# Patient Record
Sex: Female | Born: 1980 | Race: White | Hispanic: No | Marital: Married | Smoking: Current every day smoker
Health system: Southern US, Community
[De-identification: ages and names within clinical notes are randomized; demographics above are authoritative.]

## PROBLEM LIST (undated history)

## (undated) DIAGNOSIS — Z923 Personal history of irradiation: Secondary | ICD-10-CM

## (undated) DIAGNOSIS — G8929 Other chronic pain: Secondary | ICD-10-CM

## (undated) DIAGNOSIS — M7918 Myalgia, other site: Secondary | ICD-10-CM

## (undated) DIAGNOSIS — J45909 Unspecified asthma, uncomplicated: Secondary | ICD-10-CM

## (undated) DIAGNOSIS — R51 Headache: Principal | ICD-10-CM

## (undated) DIAGNOSIS — M1990 Unspecified osteoarthritis, unspecified site: Secondary | ICD-10-CM

## (undated) DIAGNOSIS — M549 Dorsalgia, unspecified: Secondary | ICD-10-CM

## (undated) HISTORY — PX: CHOLECYSTECTOMY: SHX55

## (undated) HISTORY — DX: Myalgia, other site: M79.18

## (undated) HISTORY — DX: Headache: R51

---

## 2003-06-30 ENCOUNTER — Emergency Department (HOSPITAL_COMMUNITY): Admission: EM | Admit: 2003-06-30 | Discharge: 2003-06-30 | Payer: Self-pay | Admitting: Emergency Medicine

## 2008-01-24 ENCOUNTER — Emergency Department (HOSPITAL_COMMUNITY): Admission: EM | Admit: 2008-01-24 | Discharge: 2008-01-24 | Payer: Self-pay | Admitting: Emergency Medicine

## 2008-04-10 ENCOUNTER — Emergency Department (HOSPITAL_COMMUNITY): Admission: EM | Admit: 2008-04-10 | Discharge: 2008-04-10 | Payer: Self-pay | Admitting: Emergency Medicine

## 2012-03-30 DIAGNOSIS — G8929 Other chronic pain: Secondary | ICD-10-CM

## 2012-03-30 HISTORY — DX: Other chronic pain: G89.29

## 2012-07-31 DIAGNOSIS — R079 Chest pain, unspecified: Secondary | ICD-10-CM

## 2013-01-12 ENCOUNTER — Encounter (HOSPITAL_COMMUNITY): Payer: Self-pay | Admitting: Emergency Medicine

## 2013-01-12 ENCOUNTER — Emergency Department (HOSPITAL_COMMUNITY)
Admission: EM | Admit: 2013-01-12 | Discharge: 2013-01-12 | Disposition: A | Discharge status: Home / Self Care | Payer: Self-pay | Attending: Emergency Medicine | Admitting: Emergency Medicine

## 2013-01-12 DIAGNOSIS — M25539 Pain in unspecified wrist: Secondary | ICD-10-CM | POA: Insufficient documentation

## 2013-01-12 DIAGNOSIS — J45909 Unspecified asthma, uncomplicated: Secondary | ICD-10-CM | POA: Insufficient documentation

## 2013-01-12 DIAGNOSIS — M25579 Pain in unspecified ankle and joints of unspecified foot: Secondary | ICD-10-CM | POA: Insufficient documentation

## 2013-01-12 DIAGNOSIS — F17200 Nicotine dependence, unspecified, uncomplicated: Secondary | ICD-10-CM | POA: Insufficient documentation

## 2013-01-12 DIAGNOSIS — M25569 Pain in unspecified knee: Secondary | ICD-10-CM | POA: Insufficient documentation

## 2013-01-12 DIAGNOSIS — G8929 Other chronic pain: Secondary | ICD-10-CM | POA: Insufficient documentation

## 2013-01-12 DIAGNOSIS — R1013 Epigastric pain: Secondary | ICD-10-CM | POA: Insufficient documentation

## 2013-01-12 DIAGNOSIS — R209 Unspecified disturbances of skin sensation: Secondary | ICD-10-CM | POA: Insufficient documentation

## 2013-01-12 DIAGNOSIS — Z8739 Personal history of other diseases of the musculoskeletal system and connective tissue: Secondary | ICD-10-CM | POA: Insufficient documentation

## 2013-01-12 DIAGNOSIS — Z91040 Latex allergy status: Secondary | ICD-10-CM | POA: Insufficient documentation

## 2013-01-12 HISTORY — DX: Unspecified osteoarthritis, unspecified site: M19.90

## 2013-01-12 HISTORY — DX: Unspecified asthma, uncomplicated: J45.909

## 2013-01-12 MED ORDER — FAMOTIDINE 20 MG PO TABS: 20.0000 mg | ORAL_TABLET | Freq: Two times a day (BID) | ORAL | Status: DC

## 2013-01-12 MED ORDER — OXYCODONE-ACETAMINOPHEN 5-325 MG PO TABS
1.0000 | ORAL_TABLET | Freq: Once | ORAL | Status: AC
  Administered 2013-01-12: 1 via ORAL
  Filled 2013-01-12: qty 1

## 2013-01-12 MED ORDER — MELOXICAM 7.5 MG PO TABS: 7.5000 mg | ORAL_TABLET | Freq: Every day | ORAL | Status: DC

## 2013-01-12 NOTE — ED Notes (Signed)
Chronic pain in back, knees, wrists x 1 month. Bilateral toe pain x more than 15 years.  Went to [facility] over month ago and was given Prednisone and Zanaflex for arthritis. No films were done.  Patient states meds didn't help and Zanaflex made her sick.  Has been taking Ibuprofen 600-800mg  q 4 months since other meds ran out.  No nausea/vomiting, but having epigastric pain intermittently.  No blood in stools.

## 2013-01-12 NOTE — ED Provider Notes (Signed)
CSN: [id number]     Arrival date & time 01/12/13  7829 History   First MD Initiated Contact with Patient 01/12/13 [id number]     Chief Complaint  Patient presents with  . Back Pain  . Ankle Pain  . Wrist Pain  . Knee Pain   (Consider location/radiation/quality/duration/timing/severity/associated sxs/prior Treatment) Patient is a 32 y.o. female presenting with back pain. The history is provided by the patient.  Back Pain Location:  Generalized Quality:  Shooting Radiates to:  L posterior upper leg and R posterior upper leg Pain severity:  Severe (8/10) Onset quality:  Gradual Duration:  12 months Timing:  Constant Progression:  Worsening Chronicity:  Chronic Relieved by:  Nothing Worsened by:  Movement, standing, twisting and bending Associated symptoms: abdominal pain (epigastric), leg pain, numbness and tingling   Associated symptoms: no bladder incontinence, no bowel incontinence, no dysuria, no fever and no headaches    Melanie Dillon is a 32 y.o. female who presents to the ED with pain in her back, knees, wrists and toes. The pain in her feet has been going on for 15 years. The pain in her back, knees and wrist for one year but over the past month has gotten really. She went to [facility] over a month ago and was treated for arthritis. Treated with Prednisone and Zanaflex. The Zanaflex made her sick. When she ran out of the Prednisone she stated ibuprofen and is taking 600-800 mg regularly. She is starting to have epigastric pain with the medication but no n/v.   Past Medical History  Diagnosis Date  . Asthma   . Arthritis    Past Surgical History  Procedure Laterality Date  . Cholecystectomy     History reviewed. No pertinent family history. History  Substance Use Topics  . Smoking status: Current Every Day Smoker -- 1.00 packs/day    Types: Cigarettes  . Smokeless tobacco: Not on file  . Alcohol Use: No   OB History   Grav Para Term Preterm Abortions TAB SAB  Ect Mult Living                 Review of Systems  Constitutional: Negative for fever and appetite change.  HENT: Negative for ear pain and sore throat.   Gastrointestinal: Positive for abdominal pain (epigastric). Negative for nausea, vomiting and bowel incontinence.  Genitourinary: Negative for bladder incontinence, dysuria, urgency and frequency.  Musculoskeletal: Positive for back pain.  Skin: Negative for rash.  Allergic/Immunologic: Negative for immunocompromised state.  Neurological: Positive for tingling and numbness. Negative for dizziness and headaches.  Psychiatric/Behavioral: The patient is not nervous/anxious.     Allergies  Latex  Home Medications  No current outpatient prescriptions on file. BP 132/95  Pulse 82  Temp(Src) 98.1 F (36.7 C) (Oral)  Resp 16  Ht 5' (1.524 m)  Wt 115 lb (52.164 kg)  BMI 22.46 kg/m2  SpO2 100% Physical Exam  Nursing note and vitals reviewed. Constitutional: She is oriented to person, place, and time. She appears well-developed and well-nourished.  HENT:  Head: Normocephalic and atraumatic.  Eyes: Conjunctivae and EOM are normal.  Neck: Normal range of motion. Neck supple.  Cardiovascular: Normal rate, regular rhythm and normal heart sounds.   Pulmonary/Chest: Effort normal and breath sounds normal.  Abdominal: Soft. There is tenderness in the epigastric area. There is no rebound and no guarding.  Tenderness is mild   Musculoskeletal:  Patient reports soreness with palpation over back from neck to buttocks. She has  full range of motion of her neck and back. Bilateral knees with full range of motion. Minimal pain with flexion and extension. Pedal pulses equal bilateral, adequate circulation, good touch sensation. Steady gait without foot drag. Tender bilateral ankles inner aspect with rang of motion. Pain radiates to the great toe bilateral.   Neurological: She is alert and oriented to person, place, and time. She has normal  strength and normal reflexes. No cranial nerve deficit or sensory deficit. Coordination and gait normal.  Skin: Skin is warm and dry.  Psychiatric: She has a normal mood and affect. Her behavior is normal.    ED Course  Procedures   EKG Interpretation   None       MDM  32 y.o. female with chronic pain. Here for pain management and referral. Will treat with Mobic and start her on Pepcid. She is to follow up with Dr. Gerilyn Pilgrim. Discussed with the patient and all questioned fully answered. Patient stable for discharge home without any immediate complications. Since this is a chronic problem of over a year, no further evaluation done here in the ED today. Discussed with patient importance of follow up with Dr. Gerilyn Pilgrim as soon as possible.    Medication List         famotidine 20 MG tablet  Commonly known as:  PEPCID  Take 1 tablet (20 mg total) by mouth 2 (two) times daily.     meloxicam 7.5 MG tablet  Commonly known as:  MOBIC  Take 1 tablet (7.5 mg total) by mouth daily.          [facility] Orlene Och, NP 01/12/13 1006

## 2013-01-12 NOTE — ED Provider Notes (Signed)
Medical screening examination/treatment/procedure(s) were performed by non-physician practitioner and as supervising physician I was immediately available for consultation/collaboration. Devoria Albe, MD, Armando Gang   Ward Givens, MD 01/12/13 [id number]

## 2014-03-13 ENCOUNTER — Other Ambulatory Visit (HOSPITAL_COMMUNITY): Payer: Self-pay | Admitting: *Deleted

## 2014-03-13 DIAGNOSIS — M545 Low back pain, unspecified: Secondary | ICD-10-CM

## 2014-03-19 ENCOUNTER — Encounter (HOSPITAL_COMMUNITY): Payer: Self-pay

## 2014-03-19 ENCOUNTER — Ambulatory Visit (HOSPITAL_COMMUNITY)
Admission: RE | Admit: 2014-03-19 | Discharge: 2014-03-19 | Disposition: A | Discharge status: Home / Self Care | Payer: Medicaid Other | Source: Ambulatory Visit | Attending: *Deleted | Admitting: *Deleted

## 2014-03-19 DIAGNOSIS — M545 Low back pain, unspecified: Secondary | ICD-10-CM

## 2014-04-18 ENCOUNTER — Other Ambulatory Visit (HOSPITAL_COMMUNITY): Payer: Self-pay | Admitting: Neurosurgery

## 2014-05-01 ENCOUNTER — Encounter (HOSPITAL_COMMUNITY): Payer: Self-pay

## 2014-05-01 ENCOUNTER — Encounter (HOSPITAL_COMMUNITY)
Admission: RE | Admit: 2014-05-01 | Discharge: 2014-05-01 | Disposition: A | Discharge status: Home / Self Care | Payer: Medicaid Other | Source: Ambulatory Visit | Attending: Neurosurgery | Admitting: Neurosurgery

## 2014-05-01 DIAGNOSIS — Z01812 Encounter for preprocedural laboratory examination: Secondary | ICD-10-CM | POA: Diagnosis present

## 2014-05-01 DIAGNOSIS — D329 Benign neoplasm of meninges, unspecified: Secondary | ICD-10-CM | POA: Diagnosis not present

## 2014-05-01 DIAGNOSIS — Z0183 Encounter for blood typing: Secondary | ICD-10-CM | POA: Insufficient documentation

## 2014-05-01 LAB — TYPE AND SCREEN
ABO/RH(D): A NEG
ANTIBODY SCREEN: NEGATIVE

## 2014-05-01 LAB — BASIC METABOLIC PANEL
ANION GAP: 4 — AB (ref 5–15)
BUN: 11 mg/dL (ref 6–23)
CHLORIDE: 105 mmol/L (ref 96–112)
CO2: 27 mmol/L (ref 19–32)
CREATININE: 0.93 mg/dL (ref 0.50–1.10)
Calcium: 9.1 mg/dL (ref 8.4–10.5)
GFR calc Af Amer: >90 mL/min (ref >90)
GFR, EST NON AFRICAN AMERICAN: 79 mL/min — AB (ref >90)
GLUCOSE: 70 mg/dL (ref 70–99)
POTASSIUM: 4 mmol/L (ref 3.5–5.1)
SODIUM: 136 mmol/L (ref 135–145)

## 2014-05-01 LAB — SURGICAL PCR SCREEN
MRSA, PCR: NEGATIVE
Staphylococcus aureus: NEGATIVE

## 2014-05-01 LAB — ABO/RH: ABO/RH(D): A NEG

## 2014-05-01 LAB — CBC
HEMATOCRIT: 43.9 % (ref 36.0–46.0)
Hemoglobin: 15 g/dL (ref 12.0–15.0)
MCH: 32.5 pg (ref 26.0–34.0)
MCHC: 34.2 g/dL (ref 30.0–36.0)
MCV: 95.2 fL (ref 78.0–100.0)
PLATELETS: 207 10*3/uL (ref 150–400)
RBC: 4.61 MIL/uL (ref 3.87–5.11)
RDW: 13.5 % (ref 11.5–15.5)
WBC: 4.8 10*3/uL (ref 4.0–10.5)

## 2014-05-01 NOTE — Pre-Procedure Instructions (Signed)
Melanie Dillon  05/01/2014   Your procedure is scheduled on:  May 11, 2014  Report to [facility] Admitting at 5:30 AM.  Call this number if you have problems the morning of surgery: [phone]   Remember:   Do not eat food or drink liquids after midnight.   Take these medicines the morning of surgery with A SIP OF WATER: famotidine (PEPCID), pain pill if needed    STOP MELOXICAM AND IBUPROFEN AS OF May 04, 2014 ([place])   Do not wear jewelry, make-up or nail polish.  Do not wear lotions, powders, or perfumes.   Do not shave 48 hours prior to surgery.   Do not bring valuables to the hospital.  [facility] is not responsible   for any belongings or valuables.               Contacts, dentures or bridgework may not be worn into surgery.  Leave suitcase in the car. After surgery it may be brought to your room.  For patients admitted to the hospital, discharge time is determined by you   treatment team.                 Name and phone number of your driver: family/friend  Special Instructions:  "PREPARING FOR SURGERY"   Please read over the following fact sheets that you were given: Pain Booklet, Coughing and Deep Breathing, Blood Transfusion Information and Surgical Site Infection Prevention

## 2014-05-10 MED ORDER — CEFAZOLIN SODIUM-DEXTROSE 2-3 GM-% IV SOLR
2.0000 g | INTRAVENOUS | Status: AC
  Administered 2014-05-11: 2 g via INTRAVENOUS
  Filled 2014-05-10: qty 50

## 2014-05-11 ENCOUNTER — Inpatient Hospital Stay (HOSPITAL_COMMUNITY): Payer: Medicaid Other

## 2014-05-11 ENCOUNTER — Inpatient Hospital Stay (HOSPITAL_COMMUNITY): Payer: Medicaid Other | Admitting: Certified Registered Nurse Anesthetist

## 2014-05-11 ENCOUNTER — Encounter (HOSPITAL_COMMUNITY)
Admission: RE | Disposition: A | Discharge status: Home / Self Care | Payer: Self-pay | Source: Ambulatory Visit | Attending: Neurosurgery

## 2014-05-11 ENCOUNTER — Inpatient Hospital Stay (HOSPITAL_COMMUNITY)
Admission: RE | Admit: 2014-05-11 | Discharge: 2014-05-14 | DRG: 517 | Disposition: A | Discharge status: Home / Self Care | Payer: Medicaid Other | Source: Ambulatory Visit | Attending: Neurosurgery | Admitting: Neurosurgery

## 2014-05-11 ENCOUNTER — Encounter (HOSPITAL_COMMUNITY): Payer: Self-pay | Admitting: *Deleted

## 2014-05-11 DIAGNOSIS — F17210 Nicotine dependence, cigarettes, uncomplicated: Secondary | ICD-10-CM | POA: Diagnosis present

## 2014-05-11 DIAGNOSIS — Z79899 Other long term (current) drug therapy: Secondary | ICD-10-CM

## 2014-05-11 DIAGNOSIS — D329 Benign neoplasm of meninges, unspecified: Secondary | ICD-10-CM

## 2014-05-11 DIAGNOSIS — D164 Benign neoplasm of bones of skull and face: Secondary | ICD-10-CM | POA: Diagnosis present

## 2014-05-11 DIAGNOSIS — D320 Benign neoplasm of cerebral meninges: Secondary | ICD-10-CM | POA: Diagnosis present

## 2014-05-11 DIAGNOSIS — Z7951 Long term (current) use of inhaled steroids: Secondary | ICD-10-CM | POA: Diagnosis not present

## 2014-05-11 DIAGNOSIS — H5711 Ocular pain, right eye: Secondary | ICD-10-CM | POA: Diagnosis present

## 2014-05-11 HISTORY — DX: Dorsalgia, unspecified: M54.9

## 2014-05-11 HISTORY — DX: Other chronic pain: G89.29

## 2014-05-11 HISTORY — PX: CRANIOTOMY: SHX93

## 2014-05-11 LAB — CBC
HEMATOCRIT: 36.4 % (ref 36.0–46.0)
HEMOGLOBIN: 12.1 g/dL (ref 12.0–15.0)
MCH: 31.7 pg (ref 26.0–34.0)
MCHC: 33.2 g/dL (ref 30.0–36.0)
MCV: 95.3 fL (ref 78.0–100.0)
Platelets: 199 10*3/uL (ref 150–400)
RBC: 3.82 MIL/uL — ABNORMAL LOW (ref 3.87–5.11)
RDW: 13.7 % (ref 11.5–15.5)
WBC: 5.6 10*3/uL (ref 4.0–10.5)

## 2014-05-11 LAB — POCT I-STAT 7, (LYTES, BLD GAS, ICA,H+H)
Acid-base deficit: 5 mmol/L — ABNORMAL HIGH (ref 0.0–2.0)
BICARBONATE: 20.1 meq/L (ref 20.0–24.0)
Calcium, Ion: 1.15 mmol/L (ref 1.12–1.23)
HCT: 37 % (ref 36.0–46.0)
Hemoglobin: 12.6 g/dL (ref 12.0–15.0)
O2 Saturation: 99 %
PCO2 ART: 36.7 mmHg (ref 35.0–45.0)
PO2 ART: 140 mmHg — AB (ref 80.0–100.0)
POTASSIUM: 4.6 mmol/L (ref 3.5–5.1)
Patient temperature: 36.7
Sodium: 134 mmol/L — ABNORMAL LOW (ref 135–145)
TCO2: 21 mmol/L (ref 0–100)
pH, Arterial: 7.344 — ABNORMAL LOW (ref 7.350–7.450)

## 2014-05-11 LAB — CREATININE, SERUM
Creatinine, Ser: 0.99 mg/dL (ref 0.50–1.10)
GFR calc Af Amer: 85 mL/min — ABNORMAL LOW (ref >90)
GFR, EST NON AFRICAN AMERICAN: 74 mL/min — AB (ref >90)

## 2014-05-11 LAB — HCG, SERUM, QUALITATIVE: Preg, Serum: NEGATIVE

## 2014-05-11 SURGERY — CRANIOTOMY TUMOR EXCISION
Anesthesia: General | Site: Head | Laterality: Right

## 2014-05-11 MED ORDER — PANTOPRAZOLE SODIUM 40 MG IV SOLR
40.0000 mg | Freq: Every day | INTRAVENOUS | Status: DC
  Administered 2014-05-11 – 2014-05-13 (×3): 40 mg via INTRAVENOUS
  Filled 2014-05-11 (×4): qty 40

## 2014-05-11 MED ORDER — PROPOFOL 10 MG/ML IV BOLUS
INTRAVENOUS | Status: AC
  Filled 2014-05-11: qty 20

## 2014-05-11 MED ORDER — DOCUSATE SODIUM 100 MG PO CAPS
100.0000 mg | ORAL_CAPSULE | Freq: Two times a day (BID) | ORAL | Status: DC
  Administered 2014-05-11 – 2014-05-13 (×4): 100 mg via ORAL
  Filled 2014-05-11 (×7): qty 1

## 2014-05-11 MED ORDER — ESMOLOL HCL 10 MG/ML IV SOLN
INTRAVENOUS | Status: DC | PRN
  Administered 2014-05-11 (×2): 10 mg via INTRAVENOUS

## 2014-05-11 MED ORDER — DEXAMETHASONE SODIUM PHOSPHATE 4 MG/ML IJ SOLN
4.0000 mg | Freq: Four times a day (QID) | INTRAMUSCULAR | Status: AC
  Administered 2014-05-12 – 2014-05-13 (×4): 4 mg via INTRAVENOUS
  Filled 2014-05-11 (×4): qty 1

## 2014-05-11 MED ORDER — MORPHINE SULFATE 2 MG/ML IJ SOLN
1.0000 mg | INTRAMUSCULAR | Status: DC | PRN
  Administered 2014-05-11 – 2014-05-13 (×8): 2 mg via INTRAVENOUS
  Filled 2014-05-11 (×8): qty 1

## 2014-05-11 MED ORDER — FENTANYL CITRATE 0.05 MG/ML IJ SOLN
INTRAMUSCULAR | Status: DC | PRN
  Administered 2014-05-11 (×3): 50 ug via INTRAVENOUS
  Administered 2014-05-11: 100 ug via INTRAVENOUS
  Administered 2014-05-11 (×5): 50 ug via INTRAVENOUS

## 2014-05-11 MED ORDER — FENTANYL CITRATE 0.05 MG/ML IJ SOLN
INTRAMUSCULAR | Status: AC
Start: 2014-05-11 — End: 2014-05-11
  Filled 2014-05-11: qty 5

## 2014-05-11 MED ORDER — LEVETIRACETAM IN NACL 500 MG/100ML IV SOLN
500.0000 mg | Freq: Two times a day (BID) | INTRAVENOUS | Status: DC
  Administered 2014-05-11 – 2014-05-13 (×5): 500 mg via INTRAVENOUS
  Filled 2014-05-11 (×7): qty 100

## 2014-05-11 MED ORDER — HYDROCODONE-ACETAMINOPHEN 5-325 MG PO TABS
1.0000 | ORAL_TABLET | ORAL | Status: DC | PRN
  Administered 2014-05-11 – 2014-05-14 (×11): 1 via ORAL
  Filled 2014-05-11 (×11): qty 1

## 2014-05-11 MED ORDER — SODIUM CHLORIDE 0.9 % IV SOLN
INTRAVENOUS | Status: DC | PRN
  Administered 2014-05-11: 07:00:00 via INTRAVENOUS

## 2014-05-11 MED ORDER — BUPIVACAINE HCL (PF) 0.5 % IJ SOLN
INTRAMUSCULAR | Status: DC | PRN
  Administered 2014-05-11: 5 mL

## 2014-05-11 MED ORDER — HYDROMORPHONE HCL 1 MG/ML IJ SOLN
INTRAMUSCULAR | Status: AC
  Administered 2014-05-11: 0.5 mg via INTRAVENOUS
  Filled 2014-05-11: qty 1

## 2014-05-11 MED ORDER — MIDAZOLAM HCL 2 MG/2ML IJ SOLN
INTRAMUSCULAR | Status: AC
  Filled 2014-05-11: qty 2

## 2014-05-11 MED ORDER — LIDOCAINE-EPINEPHRINE 1 %-1:100000 IJ SOLN
INTRAMUSCULAR | Status: DC | PRN
  Administered 2014-05-11: 5 mL

## 2014-05-11 MED ORDER — BACITRACIN 50000 UNITS IM SOLR
INTRAMUSCULAR | Status: DC | PRN
  Administered 2014-05-11: 09:00:00

## 2014-05-11 MED ORDER — DEXAMETHASONE SODIUM PHOSPHATE 4 MG/ML IJ SOLN
4.0000 mg | Freq: Three times a day (TID) | INTRAMUSCULAR | Status: DC
  Administered 2014-05-13 – 2014-05-14 (×3): 4 mg via INTRAVENOUS
  Filled 2014-05-11 (×6): qty 1

## 2014-05-11 MED ORDER — THROMBIN 5000 UNITS EX SOLR
OROMUCOSAL | Status: DC | PRN
  Administered 2014-05-11 (×2): via TOPICAL

## 2014-05-11 MED ORDER — DEXAMETHASONE SODIUM PHOSPHATE 10 MG/ML IJ SOLN
INTRAMUSCULAR | Status: DC | PRN
  Administered 2014-05-11: 10 mg via INTRAVENOUS

## 2014-05-11 MED ORDER — PROMETHAZINE HCL 25 MG PO TABS: 12.5000 mg | ORAL_TABLET | ORAL | Status: DC | PRN

## 2014-05-11 MED ORDER — ONDANSETRON HCL 4 MG PO TABS: 4.0000 mg | ORAL_TABLET | ORAL | Status: DC | PRN

## 2014-05-11 MED ORDER — BACITRACIN ZINC 500 UNIT/GM EX OINT
TOPICAL_OINTMENT | CUTANEOUS | Status: DC | PRN
  Administered 2014-05-11: 1 via TOPICAL

## 2014-05-11 MED ORDER — PROMETHAZINE HCL 25 MG/ML IJ SOLN: 6.2500 mg | INTRAMUSCULAR | Status: DC | PRN

## 2014-05-11 MED ORDER — NEOSTIGMINE METHYLSULFATE 10 MG/10ML IV SOLN
INTRAVENOUS | Status: DC | PRN
  Administered 2014-05-11: 3 mg via INTRAVENOUS

## 2014-05-11 MED ORDER — ONDANSETRON HCL 4 MG/2ML IJ SOLN: 4.0000 mg | INTRAMUSCULAR | Status: DC | PRN

## 2014-05-11 MED ORDER — ROCURONIUM BROMIDE 100 MG/10ML IV SOLN
INTRAVENOUS | Status: DC | PRN
  Administered 2014-05-11 (×2): 10 mg via INTRAVENOUS
  Administered 2014-05-11: 40 mg via INTRAVENOUS

## 2014-05-11 MED ORDER — CEFAZOLIN SODIUM 1-5 GM-% IV SOLN
1.0000 g | Freq: Three times a day (TID) | INTRAVENOUS | Status: AC
  Administered 2014-05-11 (×2): 1 g via INTRAVENOUS
  Filled 2014-05-11 (×2): qty 50

## 2014-05-11 MED ORDER — ALBUTEROL SULFATE (2.5 MG/3ML) 0.083% IN NEBU: 2.5000 mg | INHALATION_SOLUTION | Freq: Four times a day (QID) | RESPIRATORY_TRACT | Status: DC | PRN

## 2014-05-11 MED ORDER — ONDANSETRON HCL 4 MG/2ML IJ SOLN
INTRAMUSCULAR | Status: DC | PRN
  Administered 2014-05-11 (×2): 4 mg via INTRAVENOUS

## 2014-05-11 MED ORDER — DEXAMETHASONE SODIUM PHOSPHATE 10 MG/ML IJ SOLN
6.0000 mg | Freq: Four times a day (QID) | INTRAMUSCULAR | Status: AC
  Administered 2014-05-11 – 2014-05-12 (×4): 6 mg via INTRAVENOUS
  Filled 2014-05-11 (×4): qty 1

## 2014-05-11 MED ORDER — LABETALOL HCL 5 MG/ML IV SOLN: 10.0000 mg | INTRAVENOUS | Status: DC | PRN

## 2014-05-11 MED ORDER — FENTANYL CITRATE 0.05 MG/ML IJ SOLN
INTRAMUSCULAR | Status: AC
  Filled 2014-05-11: qty 5

## 2014-05-11 MED ORDER — THROMBIN 20000 UNITS EX SOLR
CUTANEOUS | Status: DC | PRN
  Administered 2014-05-11: 09:00:00 via TOPICAL

## 2014-05-11 MED ORDER — 0.9 % SODIUM CHLORIDE (POUR BTL) OPTIME
TOPICAL | Status: DC | PRN
  Administered 2014-05-11 (×3): 1000 mL

## 2014-05-11 MED ORDER — LIDOCAINE HCL (CARDIAC) 20 MG/ML IV SOLN
INTRAVENOUS | Status: DC | PRN
  Administered 2014-05-11: 50 mg via INTRAVENOUS
  Administered 2014-05-11: 20 mg via INTRAVENOUS
  Administered 2014-05-11: 50 mg via INTRATRACHEAL

## 2014-05-11 MED ORDER — GLYCOPYRROLATE 0.2 MG/ML IJ SOLN
INTRAMUSCULAR | Status: DC | PRN
  Administered 2014-05-11: 0.4 mg via INTRAVENOUS

## 2014-05-11 MED ORDER — MIDAZOLAM HCL 5 MG/5ML IJ SOLN
INTRAMUSCULAR | Status: DC | PRN
  Administered 2014-05-11: 1 mg via INTRAVENOUS

## 2014-05-11 MED ORDER — PROPOFOL 10 MG/ML IV BOLUS
INTRAVENOUS | Status: DC | PRN
Start: 2014-05-11 — End: 2014-05-11
  Administered 2014-05-11: 150 mg via INTRAVENOUS
  Administered 2014-05-11: 50 mg via INTRAVENOUS
  Administered 2014-05-11: 20 mg via INTRAVENOUS

## 2014-05-11 MED ORDER — MANNITOL 25 % IV SOLN
INTRAVENOUS | Status: DC | PRN
  Administered 2014-05-11: 25 g via INTRAVENOUS

## 2014-05-11 MED ORDER — LEVETIRACETAM IN NACL 1000 MG/100ML IV SOLN
1000.0000 mg | INTRAVENOUS | Status: AC
  Administered 2014-05-11: 1000 mg via INTRAVENOUS
  Filled 2014-05-11: qty 100

## 2014-05-11 MED ORDER — SODIUM CHLORIDE 0.9 % IV SOLN
INTRAVENOUS | Status: DC
  Administered 2014-05-11 – 2014-05-14 (×4): via INTRAVENOUS

## 2014-05-11 MED ORDER — HEMOSTATIC AGENTS (NO CHARGE) OPTIME
TOPICAL | Status: DC | PRN
  Administered 2014-05-11: 1 via TOPICAL

## 2014-05-11 MED ORDER — HEPARIN SODIUM (PORCINE) 5000 UNIT/ML IJ SOLN
5000.0000 [IU] | Freq: Three times a day (TID) | INTRAMUSCULAR | Status: DC
  Administered 2014-05-12 – 2014-05-14 (×7): 5000 [IU] via SUBCUTANEOUS
  Filled 2014-05-11 (×10): qty 1

## 2014-05-11 MED ORDER — SENNA 8.6 MG PO TABS
1.0000 | ORAL_TABLET | Freq: Two times a day (BID) | ORAL | Status: DC
  Administered 2014-05-12 – 2014-05-13 (×3): 8.6 mg via ORAL
  Filled 2014-05-11 (×7): qty 1

## 2014-05-11 MED ORDER — HYDROMORPHONE HCL 1 MG/ML IJ SOLN
0.2500 mg | INTRAMUSCULAR | Status: DC | PRN
  Administered 2014-05-11 (×4): 0.5 mg via INTRAVENOUS

## 2014-05-11 SURGICAL SUPPLY — 101 items
14 FR 5 CC SILICONE FOLEY CATHETER ×3 IMPLANT
BENZOIN TINCTURE PRP APPL 2/3 (GAUZE/BANDAGES/DRESSINGS) IMPLANT
BLADE CLIPPER SURG (BLADE) ×3 IMPLANT
BLADE SAW GIGLI 16 STRL (MISCELLANEOUS) IMPLANT
BLADE SURG 15 STRL LF DISP TIS (BLADE) IMPLANT
BLADE SURG 15 STRL SS (BLADE)
BLADE ULTRA TIP 2M (BLADE) ×3 IMPLANT
BNDG GAUZE ELAST 4 BULKY (GAUZE/BANDAGES/DRESSINGS) ×3 IMPLANT
BRUSH SCRUB EZ 1% IODOPHOR (MISCELLANEOUS) IMPLANT
BUR ACORN 6.0 PRECISION (BURR) ×2 IMPLANT
BUR ACORN 6.0MM PRECISION (BURR) ×1
BUR ADDG 1.1 (BURR) IMPLANT
BUR ADDG 1.1MM (BURR)
BUR MATCHSTICK NEURO 3.0 LAGG (BURR) IMPLANT
BUR ROUND FLUTED 4 SOFT TCH (BURR) ×10 IMPLANT
BUR ROUND FLUTED 4MM SOFT TCH (BURR) ×5
BUR ROUTER D-58 CRANI (BURR) ×3 IMPLANT
CANISTER SUCT 3000ML PPV (MISCELLANEOUS) ×6 IMPLANT
CATH VENTRIC 35X38 W/TROCAR LG (CATHETERS) IMPLANT
CLIP TI MEDIUM 6 (CLIP) IMPLANT
CONT SPEC 4OZ CLIKSEAL STRL BL (MISCELLANEOUS) ×9 IMPLANT
CORDS BIPOLAR (ELECTRODE) ×3 IMPLANT
COVER MAYO STAND STRL (DRAPES) IMPLANT
DECANTER SPIKE VIAL GLASS SM (MISCELLANEOUS) ×3 IMPLANT
DRAIN SNY WOU 7FLT (WOUND CARE) IMPLANT
DRAIN SUBARACHNOID (WOUND CARE) IMPLANT
DRAPE MICROSCOPE LEICA (MISCELLANEOUS) IMPLANT
DRAPE NEUROLOGICAL W/INCISE (DRAPES) ×3 IMPLANT
DRAPE PROXIMA HALF (DRAPES) ×3 IMPLANT
DRAPE STERI IOBAN 125X83 (DRAPES) IMPLANT
DRAPE SURG 17X23 STRL (DRAPES) IMPLANT
DRAPE WARM FLUID 44X44 (DRAPE) ×3 IMPLANT
DRSG TELFA 3X8 NADH (GAUZE/BANDAGES/DRESSINGS) IMPLANT
DURAPREP 6ML APPLICATOR 50/CS (WOUND CARE) ×3 IMPLANT
ELECT CAUTERY BLADE 6.4 (BLADE) IMPLANT
ELECT REM PT RETURN 9FT ADLT (ELECTROSURGICAL) ×3
ELECTRODE REM PT RTRN 9FT ADLT (ELECTROSURGICAL) ×1 IMPLANT
EVACUATOR 1/8 PVC DRAIN (DRAIN) IMPLANT
EVACUATOR SILICONE 100CC (DRAIN) IMPLANT
GAUZE SPONGE 4X4 12PLY STRL (GAUZE/BANDAGES/DRESSINGS) ×3 IMPLANT
GAUZE SPONGE 4X4 16PLY XRAY LF (GAUZE/BANDAGES/DRESSINGS) IMPLANT
GLOVE BIOGEL PI IND STRL 7.5 (GLOVE) ×4 IMPLANT
GLOVE BIOGEL PI INDICATOR 7.5 (GLOVE) ×8
GLOVE ECLIPSE 6.5 STRL STRAW (GLOVE) IMPLANT
GLOVE ECLIPSE 7.5 STRL STRAW (GLOVE) IMPLANT
GLOVE EXAM NITRILE LRG STRL (GLOVE) IMPLANT
GLOVE EXAM NITRILE MD LF STRL (GLOVE) IMPLANT
GLOVE EXAM NITRILE XL STR (GLOVE) IMPLANT
GLOVE EXAM NITRILE XS STR PU (GLOVE) IMPLANT
GLOVE SURG SS PI 7.0 STRL IVOR (GLOVE) ×15 IMPLANT
GLOVE SURG SS PI 8.0 STRL IVOR (GLOVE) ×3 IMPLANT
GOWN STRL REUS W/ TWL LRG LVL3 (GOWN DISPOSABLE) ×1 IMPLANT
GOWN STRL REUS W/ TWL XL LVL3 (GOWN DISPOSABLE) ×3 IMPLANT
GOWN STRL REUS W/TWL 2XL LVL3 (GOWN DISPOSABLE) IMPLANT
GOWN STRL REUS W/TWL LRG LVL3 (GOWN DISPOSABLE) ×2
GOWN STRL REUS W/TWL XL LVL3 (GOWN DISPOSABLE) ×6
HEMOSTAT POWDER KIT SURGIFOAM (HEMOSTASIS) ×6 IMPLANT
HEMOSTAT SURGICEL 2X14 (HEMOSTASIS) ×3 IMPLANT
KIT BASIN OR (CUSTOM PROCEDURE TRAY) ×3 IMPLANT
KIT DRAIN CSF ACCUDRAIN (MISCELLANEOUS) IMPLANT
KIT ROOM TURNOVER OR (KITS) ×3 IMPLANT
KNIFE ARACHNOID DISP AM-24-S (MISCELLANEOUS) IMPLANT
MARKER SPHERE PSV REFLC 13MM (MARKER) ×6 IMPLANT
NEEDLE HYPO 25X1 1.5 SAFETY (NEEDLE) ×3 IMPLANT
NEEDLE SPNL 18GX3.5 QUINCKE PK (NEEDLE) IMPLANT
NS IRRIG 1000ML POUR BTL (IV SOLUTION) ×9 IMPLANT
PACK CRANIOTOMY (CUSTOM PROCEDURE TRAY) ×3 IMPLANT
PAD EYE OVAL STERILE LF (GAUZE/BANDAGES/DRESSINGS) IMPLANT
PATTIES SURGICAL .25X.25 (GAUZE/BANDAGES/DRESSINGS) IMPLANT
PATTIES SURGICAL .5 X.5 (GAUZE/BANDAGES/DRESSINGS) IMPLANT
PATTIES SURGICAL .5 X3 (DISPOSABLE) IMPLANT
PATTIES SURGICAL 1/4 X 3 (GAUZE/BANDAGES/DRESSINGS) IMPLANT
PATTIES SURGICAL 1X1 (DISPOSABLE) IMPLANT
PLATE 1.5  2HOLE LNG NEURO (Plate) ×6 IMPLANT
PLATE 1.5 2HOLE LNG NEURO (Plate) ×3 IMPLANT
PLATE 1.5/0.3 85X53M SM PANEL (Plate) ×3 IMPLANT
RUBBERBAND STERILE (MISCELLANEOUS) IMPLANT
SCREW SELF DRILL HT 1.5/4MM (Screw) ×30 IMPLANT
SPECIMEN JAR SMALL (MISCELLANEOUS) IMPLANT
SPONGE NEURO XRAY DETECT 1X3 (DISPOSABLE) IMPLANT
SPONGE SURGIFOAM ABS GEL 100 (HEMOSTASIS) ×3 IMPLANT
STAPLER VISISTAT 35W (STAPLE) ×3 IMPLANT
STOCKINETTE 6  STRL (DRAPES) ×2
STOCKINETTE 6 STRL (DRAPES) ×1 IMPLANT
SUT ETHILON 3 0 FSL (SUTURE) IMPLANT
SUT ETHILON 3 0 PS 1 (SUTURE) IMPLANT
SUT NURALON 4 0 TR CR/8 (SUTURE) ×6 IMPLANT
SUT SILK 0 TIES 10X30 (SUTURE) IMPLANT
SUT VIC AB 0 CT1 18XCR BRD8 (SUTURE) ×3 IMPLANT
SUT VIC AB 0 CT1 8-18 (SUTURE) ×6
SUT VIC AB 3-0 SH 8-18 (SUTURE) ×9 IMPLANT
SYR CONTROL 10ML LL (SYRINGE) ×3 IMPLANT
TIP SONASTAR STD MISONIX 1.9 (TRAY / TRAY PROCEDURE) IMPLANT
TOWEL OR 17X24 6PK STRL BLUE (TOWEL DISPOSABLE) ×3 IMPLANT
TOWEL OR 17X26 10 PK STRL BLUE (TOWEL DISPOSABLE) ×3 IMPLANT
TRAY FOLEY BAG SILVER LF 16FR (CATHETERS) ×3 IMPLANT
TRAY FOLEY CATH 14FRSI W/METER (CATHETERS) IMPLANT
TUBE CONNECTING 12'X1/4 (SUCTIONS) ×1
TUBE CONNECTING 12X1/4 (SUCTIONS) ×2 IMPLANT
UNDERPAD 30X30 INCONTINENT (UNDERPADS AND DIAPERS) ×3 IMPLANT
WATER STERILE IRR 1000ML POUR (IV SOLUTION) ×3 IMPLANT

## 2014-05-11 NOTE — Op Note (Signed)
PREOP DIAGNOSIS:  1. Right sphenoid tumor   POSTOP DIAGNOSIS: Same  PROCEDURE: 1. Right orbital frontotemporal craniotomy for resection of sphenoid tumor 2. Use of intraoperative microscope for microdissection 3. Use of intraoperative stereotaxy  SURGEON: Dr. Consuella Lose, MD  ASSISTANT: None  ANESTHESIA: General Endotracheal  EBL: 400cc  SPECIMENS: Right sphenoid tumor for permanent pathology  DRAINS: None  COMPLICATIONS: None immediate  CONDITION: Hemodynamically stable to PACU  HISTORY: Melanie Dillon is a 34 y.o. female who initially presented to the outpatient neurosurgery clinic with right-sided eye pain and proptosis. She underwent CT scan and MRI demonstrating a right sphenoid based tumor with enlargement of the sphenoid bone, and growth into the right lateral orbit. Previous CT scan did not demonstrate a similar lesion done approximately 10 years ago indicating interval growth. Given the patient's age, symptoms, and evidence of growth, surgical resection was indicated. The risks and benefits of the surgery were explained in detail to the patient and her husband. After all their questions were answered, informed consent was obtained.  PROCEDURE IN DETAIL: After informed consent was obtained and witnessed, the patient was brought to the operating room. After induction of general anesthesia, the patient was positioned on the operative table in the supine position. All pressure points were meticulously padded. The 3-point [place] head holder was then applied to the patient, and affixed to the table. Using the preoperative MRI scan, surface markers were co-registered with the scan until satisfactory accuracy was achieved. A standard pterional curvilinear skin incision was then marked out extending from the midline to the root of the zygoma. Skin incision was then prepped and draped in the usual sterile fashion.  After timeout was conducted, the skin incision was  infiltrated with local anesthetic with epinephrine. Skin incision was then made sharply and Raney clips were applied for hemostasis. The deep temporal fascia was identified, and the skin flap was elevated and reflected anteriorly. The temporalis muscle was then incised and reflected inferiorly. A frontotemporal craniotomy was then fashioned with bur holes which were connected with the craniotome. The region of the right sphenoid was noted to be very thick. Portions of the sphenoid bone were then removed using the drill and rongeurs and sent for permanent pathology.  The microscope was then draped sterilely and brought into the field, and the remainder of the sphenoid resection was done under the microscope using microdissection. Using a combination of a 4 mm fluted bur, and rongeurs, the sphenoid bone was drilled down, until the superior orbital fissure was identified proximally. This was then traced distally until the orbit was identified. Under the microscope, the lateral wall of the orbit was then meticulously removed, until the floor of the temporal fossa was identified inferiorly, as was the inferior orbital fissure. Intraoperative stereotaxy was used periodically throughout the drilling to assist in location along the orbit.  After the sphenoid resection was complete, good hemostasis was achieved using a combination of bipolar electrocautery as well as morcellized Gelfoam and thrombin.  At this point the wound is irrigated with copious amounts of normal saline irrigation. The bone flap was then replaced with standard titanium plates and screws. A small piece of titanium mesh was placed over the sphenoid defect for the temporalis muscle to sit on. Muscle was then closed using interrupted 0 Vicryl stitches, and the galea was closed using interrupted 3-0 Vicryl sutures. The skin was closed using standard surgical skin staples. Sterile dressing was then applied after the [place] head holder was removed. The  patient was then transferred to the stretcher and taken to the PACU in stable hemodynamic condition.  At the end of the case all sponge, needle, and instrument counts were correct.

## 2014-05-11 NOTE — Progress Notes (Signed)
Pt seen in PACU. Drowsy, but easily arousable. Follows commands briskly in all extremities. Vision OD grossly intact

## 2014-05-11 NOTE — Anesthesia Postprocedure Evaluation (Signed)
  Anesthesia Post-op Note  Patient: Melanie Dillon  Procedure(s) Performed: Procedure(s) with comments: Right Frontal Temporal Craniotomy for resection of meningioma (Right) - Right Frontal Temporal Craniotomy for resection of meningioma  Patient Location: PACU  Anesthesia Type:General  Level of Consciousness: awake  Airway and Oxygen Therapy: Patient Spontanous Breathing  Post-op Pain: mild  Post-op Assessment: Post-op Vital signs reviewed  Post-op Vital Signs: Reviewed  Last Vitals:  Filed Vitals:   05/11/14 1235  BP: 120/63  Pulse: 82  Temp:   Resp: 12    Complications: No apparent anesthesia complications

## 2014-05-11 NOTE — Anesthesia Preprocedure Evaluation (Addendum)
Anesthesia Evaluation  Patient identified by MRN, date of birth, ID band Patient awake    Airway Mallampati: II  TM Distance: >3 FB Neck ROM: Full    Dental  (+) Poor Dentition, Dental Advisory Given   Pulmonary asthma , Current Smoker,  breath sounds clear to auscultation        Cardiovascular negative cardio ROS  Rhythm:Regular Rate:Normal     Neuro/Psych    GI/Hepatic negative GI ROS, Neg liver ROS,   Endo/Other  negative endocrine ROS  Renal/GU negative Renal ROS     Musculoskeletal   Abdominal   Peds  Hematology   Anesthesia Other Findings   Reproductive/Obstetrics                            Anesthesia Physical Anesthesia Plan  ASA: III  Anesthesia Plan: General   Post-op Pain Management:    Induction: Intravenous  Airway Management Planned: Oral ETT  Additional Equipment:   Intra-op Plan:   Post-operative Plan: Possible Post-op intubation/ventilation  Informed Consent: I have reviewed the patients History and Physical, chart, labs and discussed the procedure including the risks, benefits and alternatives for the proposed anesthesia with the patient or authorized representative who has indicated his/her understanding and acceptance.     Plan Discussed with: CRNA and Anesthesiologist  Anesthesia Plan Comments:         Anesthesia Quick Evaluation

## 2014-05-11 NOTE — Progress Notes (Signed)
[facility] called, no staff available, so informed Donnie, transporter, that Hcg was drawn and sent at 0610, results pending. Note also placed on front of chart.

## 2014-05-11 NOTE — Anesthesia Procedure Notes (Signed)
Procedure Name: Intubation Date/Time: 05/11/2014 7:53 AM Performed by: Shirlyn Goltz Pre-anesthesia Checklist: Patient identified, Emergency Drugs available, Suction available and Patient being monitored Patient Re-evaluated:Patient Re-evaluated prior to inductionOxygen Delivery Method: Circle system utilized Preoxygenation: Pre-oxygenation with 100% oxygen Intubation Type: IV induction Ventilation: Mask ventilation without difficulty Laryngoscope Size: Miller and 2 Grade View: Grade II Tube type: Oral Tube size: 7.0 mm Number of attempts: 1 Airway Equipment and Method: Stylet Placement Confirmation: ETT inserted through vocal cords under direct vision,  positive ETCO2 and breath sounds checked- equal and bilateral Secured at: 22 cm Tube secured with: Tape Dental Injury: Teeth and Oropharynx as per pre-operative assessment

## 2014-05-11 NOTE — H&P (Signed)
CC:  Eye pain  HPI: Melanie Dillon is a 34 year old woman seen for initial consultation in the office. She comes in with a history of right-sided thigh pain which started a few weeks ago. The patient does have a fairly extensive history of migraine headaches, but this pain is different, and behind her right eye. She says her brother was in town visiting over the holidays a few weeks ago, and noted that her right eye was sticking out further than her left. She therefore was seen by her primary doctor, and CT scan and subsequently MRI was done which demonstrated a right-sided likely meningioma. The patient does not have any changes with her vision itself, and says that she sees fine out of both eyes. She does not have any numbness, tingling, or weakness of the extremities. She has not had any seizures.   PMH: Past Medical History  Diagnosis Date  . Asthma   . Arthritis   . Chronic back pain greater than 3 months duration 2014    PSH: Past Surgical History  Procedure Laterality Date  . Cholecystectomy      SH: History  Substance Use Topics  . Smoking status: Current Every Day Smoker -- 1.00 packs/day    Types: Cigarettes  . Smokeless tobacco: Never Used  . Alcohol Use: No    MEDS: Prior to Admission medications   Medication Sig Start Date End Date Taking? Authorizing Provider  albuterol (PROVENTIL HFA;VENTOLIN HFA) 108 (90 BASE) MCG/ACT inhaler Inhale 2 puffs into the lungs every 6 (six) hours as needed for wheezing or shortness of breath.   Yes Historical Provider, MD  Hydrocodone-Acetaminophen 5-300 MG TABS Take 1 tablet by mouth every 4 (four) hours as needed. pain 04/13/14  Yes Historical Provider, MD  ibuprofen (ADVIL,MOTRIN) 800 MG tablet Take 800 mg by mouth every 8 (eight) hours as needed for mild pain.   Yes Historical Provider, MD  traMADol (ULTRAM) 50 MG tablet Take 50 mg by mouth every 6 (six) hours as needed for moderate pain.   Yes Historical Provider, MD  famotidine  (PEPCID) 20 MG tablet Take 1 tablet (20 mg total) by mouth 2 (two) times daily. Patient not taking: Reported on 04/27/2014 01/12/13   Ashley Murrain, NP  medroxyPROGESTERone (DEPO-PROVERA) 150 MG/ML injection Inject 1 mL into the muscle every 3 (three) months. 04/09/14   Historical Provider, MD  meloxicam (MOBIC) 7.5 MG tablet Take 1 tablet (7.5 mg total) by mouth daily. Patient not taking: Reported on 04/27/2014 01/12/13   Ashley Murrain, NP    ALLERGY: Allergies  Allergen Reactions  . Latex Itching    ROS: ROS  NEUROLOGIC EXAM: Awake, alert, oriented Memory and concentration grossly intact Speech fluent, appropriate CN grossly intact Motor exam: Upper Extremities Deltoid Bicep Tricep Grip  Right 5/5 5/5 5/5 5/5  Left 5/5 5/5 5/5 5/5   Lower Extremity IP Quad PF DF EHL  Right 5/5 5/5 5/5 5/5 5/5  Left 5/5 5/5 5/5 5/5 5/5   Sensation grossly intact to LT  [facility]: CT scan of the brain with coronal and sagittal reconstructions was reviewed. This demonstrates hyperostosis involving the right sphenoid bone, and extending down into the skull base, to the level of foramen rotundum. There is no stenosis of the optic canal. The sphenoid hyperostosis does extend into the lateral orbit, with some mass effect upon the lateral rectus muscle.  MRI of the brain was reviewed which demonstrates slight dural thickening at the temporal pole, with dural tail extending  into the anterior and middle cranial fossa.  Previous CT scan in 2005 of the head does not demonstrate any of the above findings, and was essentially normal.   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 This lesion has grown within the last 10 years.  PLAN: Proceed with stereotctic right frontotemporal craniotomy for resection of the sphenoid meningioma  I did review the CT and MRI findings with the patient and her husband.  Given her symptoms including the proptosis, and the growth of the lesion over the last several years, I did offer surgical decompression. I did explain to them the risks of the procedure including the risk of stroke, hemorrhage, optic nerve injury, seizures, hydrocephalus, bleeding, and infection. I did also review possible postoperative outcomes including persistence of pain, proptosis, and the possibility of tumor regrowth in the future. The patient and her husband understood our discussion and willing to proceed with surgery. All their questions were answered.

## 2014-05-11 NOTE — Transfer of Care (Signed)
Immediate Anesthesia Transfer of Care Note  Patient: Melanie Dillon  Procedure(s) Performed: Procedure(s) with comments: Right Frontal Temporal Craniotomy for resection of meningioma (Right) - Right Frontal Temporal Craniotomy for resection of meningioma  Patient Location: PACU  Anesthesia Type:General  Level of Consciousness: awake and patient cooperative  Airway & Oxygen Therapy: Patient Spontanous Breathing and Patient connected to nasal cannula oxygen  Post-op Assessment: Report given to RN, Post -op Vital signs reviewed and stable, Patient moving all extremities and Patient moving all extremities X 4  Post vital signs: Reviewed and stable  Last Vitals:  Filed Vitals:   05/11/14 0624  BP: 137/59  Pulse: 70  Temp: 36.9 C  Resp: 18    Complications: No apparent anesthesia complications

## 2014-05-12 MED ORDER — GUAIFENESIN 100 MG/5ML PO SYRP
200.0000 mg | ORAL_SOLUTION | Freq: Four times a day (QID) | ORAL | Status: DC | PRN
  Administered 2014-05-12 – 2014-05-13 (×2): 200 mg via ORAL
  Filled 2014-05-12 (×4): qty 10

## 2014-05-12 NOTE — Progress Notes (Signed)
Subjective: Patient reports she is doing okay. She has appropriate headache or head soreness. She has right facial swelling. Denies visual difficulties or diplopia.  Objective: Vital signs in last 24 hours: Temp:  [98 F (36.7 C)-99.2 F (37.3 C)] 98.7 F (37.1 C) (02/13 0729) Pulse Rate:  [50-99] 50 (02/13 0640) Resp:  [11-24] 15 (02/13 0640) BP: (96-129)/(56-85) 119/58 mmHg (02/13 0600) SpO2:  [93 %-100 %] 93 % (02/13 0640) Arterial Line BP: (78-148)/(53-97) 119/66 mmHg (02/13 0640) Weight:  [130 lb 1.1 oz (59 kg)] 130 lb 1.1 oz (59 kg) (02/12 1400)  Intake/Output from previous day: 02/12 0730 - 02/13 0729 In: 2472.5 [P.O.:60; I.V.:2212.5; IV Piggyback:200] Out: 1775 [Urine:1375; Blood:400] Intake/Output this shift:    Neurologic: Grossly normal , mild edema around the right eye but significant swelling of the right cheek, otherwise her neurologic exam appears to be okay Lab Results: Lab Results  Component Value Date   WBC 5.6 05/11/2014   HGB 12.1 05/11/2014   HCT 36.4 05/11/2014   MCV 95.3 05/11/2014   PLT 199 05/11/2014   No results found for: INR, PROTIME BMET Lab Results  Component Value Date   NA 134* 05/11/2014   K 4.6 05/11/2014   CL 105 05/01/2014   CO2 27 05/01/2014   GLUCOSE 70 05/01/2014   BUN 11 05/01/2014   CREATININE 0.99 05/11/2014   CALCIUM 9.1 05/01/2014    Studies/Results: Ct Head Wo Contrast  05/11/2014   CLINICAL DATA:  Post meningioma surgery with resection of right sphenoid bone. Surgery today.  EXAM: CT HEAD WITHOUT CONTRAST  TECHNIQUE: Contiguous axial images were obtained from the base of the skull through the vertex without intravenous contrast.  COMPARISON:  CT 03/28/2014, MRI 04/04/2014  FINDINGS: There has been partial resection of the sphenoid bone on the right. There has been partial resection of the lateral orbit on the right. Postoperative fluid is seen in the surgical bed. Improved mass-effect on the lateral orbit compared with the  preoperative study. There has been reconstruction of the right frontal craniotomy with a plate.  There is sclerotic bone involving the floor of the middle cranial fossa on the right compatible with residual tumor in the sphenoid bone. This is similar to the sclerotic appearance of the greater wing of the sphenoid on the preoperative study. Sclerotic changes extend to the foramen rotundum and near the foramina ovale without significant narrowing of the foramen. There likely is residual bony diseased along the orbital roof at the resection site.  Ventricle size is normal. Small amount of subdural gas is present on the right at the craniotomy site. No hemorrhage is present. Slight midline shift to the left of approximately 3 mm.  Negative for acute infarct.  IMPRESSION: Postop resection of the greater wing of the sphenoid bone and lateral orbit on the right. Pathology is pending. There remains sclerotic bone in the floor of the middle cranial fossa on the right which may be due to residual tumor. Sclerotic changes also at the resection site of the orbital roof.  Mild amount of subdural gas on the right. No acute hemorrhage or infarct.   Electronically Signed   By: Franchot Gallo M.D.   On: 05/11/2014 17:12    Assessment/Plan: Teams to be doing okay on postop day 1. We'll remove Foley and arterial line and mobilize today.   LOS: 1 day    Melanie Dillon S 05/12/2014, 8:22 AM

## 2014-05-13 NOTE — Progress Notes (Signed)
Subjective: Patient reports she'Dillon doing the same. Some soreness  Objective: Vital signs in last 24 hours: Temp:  [98.1 F (36.7 C)-98.6 F (37 C)] 98.5 F (36.9 C) (02/14 0739) Pulse Rate:  [47-93] 48 (02/14 0700) Resp:  [14-27] 14 (02/14 0700) BP: (98-126)/(54-75) 110/72 mmHg (02/14 0700) SpO2:  [91 %-97 %] 94 % (02/14 0700)  Intake/Output from previous day: 02/13 0730 - 02/14 0729 In: 2730 [P.O.:780; I.V.:1800; IV Piggyback:150] Out: 2350 [Urine:2350] Intake/Output this shift:    Neurologic: Grossly normal, stable selling in r face, awake/ alert/ conversant  Lab Results: Lab Results  Component Value Date   WBC 5.6 05/11/2014   HGB 12.1 05/11/2014   HCT 36.4 05/11/2014   MCV 95.3 05/11/2014   PLT 199 05/11/2014   No results found for: INR, PROTIME BMET Lab Results  Component Value Date   NA 134* 05/11/2014   K 4.6 05/11/2014   CL 105 05/01/2014   CO2 27 05/01/2014   GLUCOSE 70 05/01/2014   BUN 11 05/01/2014   CREATININE 0.99 05/11/2014   CALCIUM 9.1 05/01/2014    Studies/Results: Ct Head Wo Contrast  05/11/2014   CLINICAL DATA:  Post meningioma surgery with resection of right sphenoid bone. Surgery today.  EXAM: CT HEAD WITHOUT CONTRAST  TECHNIQUE: Contiguous axial images were obtained from the base of the skull through the vertex without intravenous contrast.  COMPARISON:  CT 03/28/2014, MRI 04/04/2014  FINDINGS: There has been partial resection of the sphenoid bone on the right. There has been partial resection of the lateral orbit on the right. Postoperative fluid is seen in the surgical bed. Improved mass-effect on the lateral orbit compared with the preoperative study. There has been reconstruction of the right frontal craniotomy with a plate.  There is sclerotic bone involving the floor of the middle cranial fossa on the right compatible with residual tumor in the sphenoid bone. This is similar to the sclerotic appearance of the greater wing of the sphenoid on  the preoperative study. Sclerotic changes extend to the foramen rotundum and near the foramina ovale without significant narrowing of the foramen. There likely is residual bony diseased along the orbital roof at the resection site.  Ventricle size is normal. Small amount of subdural gas is present on the right at the craniotomy site. No hemorrhage is present. Slight midline shift to the left of approximately 3 mm.  Negative for acute infarct.  IMPRESSION: Postop resection of the greater wing of the sphenoid bone and lateral orbit on the right. Pathology is pending. There remains sclerotic bone in the floor of the middle cranial fossa on the right which may be due to residual tumor. Sclerotic changes also at the resection site of the orbital roof.  Mild amount of subdural gas on the right. No acute hemorrhage or infarct.   Electronically Signed   By: Franchot Gallo M.D.   On: 05/11/2014 17:12    Assessment/Plan: Making the expected recovery; CCM   LOS: 2 days    Melanie Dillon 05/13/2014, 8:14 AM

## 2014-05-14 ENCOUNTER — Encounter (HOSPITAL_COMMUNITY): Payer: Self-pay | Admitting: Neurosurgery

## 2014-05-14 MED ORDER — HYDROCODONE-ACETAMINOPHEN 5-325 MG PO TABS: 1.0000 | ORAL_TABLET | ORAL | Status: DC | PRN

## 2014-05-14 MED ORDER — PANTOPRAZOLE SODIUM 40 MG PO TBEC
40.0000 mg | DELAYED_RELEASE_TABLET | Freq: Every day | ORAL | Status: DC
  Administered 2014-05-14: 40 mg via ORAL
  Filled 2014-05-14: qty 1

## 2014-05-14 MED ORDER — LEVETIRACETAM 500 MG PO TABS
500.0000 mg | ORAL_TABLET | Freq: Two times a day (BID) | ORAL | Status: DC
  Filled 2014-05-14 (×2): qty 1

## 2014-05-14 NOTE — Progress Notes (Signed)
UR completed.  Rhealynn Myhre, RN BSN MHA CCM Trauma/Neuro ICU Case Manager [phone]  

## 2014-05-14 NOTE — Discharge Summary (Signed)
  Physician Discharge Summary  Patient ID: Melanie Dillon MRN: [id number] DOB/AGE: [birth year] 34 y.o.  Admit date: 05/11/2014 Discharge date: 05/14/2014  Admission Diagnoses: Meningioma  Discharge Diagnoses: Same Active Problems:   Meningioma   Discharged Condition: Stable  Hospital Course:  Mrs. Melanie Dillon is a 34 y.o. 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 She was at her neurologic baseline postop, and had an uneventful hospital course. Her facial swelling improved, she was tolerating diet, ambulating, voiding, and pain was under control.  Treatments: Surgery - Right orbito-fronto-temporal craniotomy for resection of tumor  Discharge Exam: Blood pressure 135/74, pulse 49, temperature 98.5 F (36.9 C), temperature source Oral, resp. rate 18, height 4\' 11"  (1.499 m), weight 60.2 kg (132 lb 11.5 oz), SpO2 95 %. Awake, alert, oriented Speech fluent, appropriate CN grossly intact 5/5 BUE/BLE Wound c/d/i  Follow-up: Follow-up in my office [facility] [phone]) in 1-2 week  Disposition: 01-Home or Self Care     Medication List    STOP taking these medications        Hydrocodone-Acetaminophen 5-300 MG Tabs  Replaced by:  HYDROcodone-acetaminophen 5-325 MG per tablet      TAKE these medications        albuterol 108 (90 BASE) MCG/ACT inhaler  Commonly known as:  PROVENTIL HFA;VENTOLIN HFA  Inhale 2 puffs into the lungs every 6 (six) hours as needed for wheezing or shortness of breath.     famotidine 20 MG tablet  Commonly known as:  PEPCID  Take 1 tablet (20 mg total) by mouth 2 (two) times daily.     HYDROcodone-acetaminophen 5-325 MG per tablet  Commonly known as:  NORCO/VICODIN  Take 1 tablet by mouth every 4 (four) hours as needed for moderate pain.     ibuprofen 800 MG tablet  Commonly known as:  ADVIL,MOTRIN  Take 800 mg by mouth every 8 (eight) hours as needed for mild pain.     medroxyPROGESTERone 150 MG/ML injection  Commonly known as:  DEPO-PROVERA  Inject 1 mL into the muscle every 3 (three) months.     meloxicam 7.5 MG tablet  Commonly known as:  MOBIC  Take 1 tablet (7.5 mg total) by mouth daily.     traMADol 50 MG tablet  Commonly known as:  ULTRAM  Take 50 mg by mouth every 6 (six) hours as needed for moderate pain.         SignedConsuella Lose, C 05/14/2014, 9:46 AM

## 2014-05-14 NOTE — Progress Notes (Signed)
Discharge and medication instructions given to patient with family present at bedside.  No questions or concerns voiced.  Patient discharged home.

## 2014-07-20 ENCOUNTER — Ambulatory Visit (HOSPITAL_COMMUNITY)
Admission: RE | Admit: 2014-07-20 | Discharge: 2014-07-20 | Disposition: A | Discharge status: Home / Self Care | Payer: Medicaid Other | Source: Ambulatory Visit | Attending: Internal Medicine | Admitting: Internal Medicine

## 2014-07-20 ENCOUNTER — Other Ambulatory Visit (HOSPITAL_COMMUNITY): Payer: Self-pay | Admitting: Internal Medicine

## 2014-07-20 DIAGNOSIS — J40 Bronchitis, not specified as acute or chronic: Secondary | ICD-10-CM

## 2014-07-20 DIAGNOSIS — R062 Wheezing: Secondary | ICD-10-CM | POA: Insufficient documentation

## 2014-12-25 ENCOUNTER — Ambulatory Visit (INDEPENDENT_AMBULATORY_CARE_PROVIDER_SITE_OTHER): Payer: Medicaid Other | Admitting: Neurology

## 2014-12-25 ENCOUNTER — Encounter: Payer: Self-pay | Admitting: Neurology

## 2014-12-25 VITALS — BP 135/82 | HR 77 | Ht 59.0 in | Wt 126.0 lb

## 2014-12-25 DIAGNOSIS — R51 Headache: Secondary | ICD-10-CM

## 2014-12-25 DIAGNOSIS — R519 Headache, unspecified: Secondary | ICD-10-CM | POA: Insufficient documentation

## 2014-12-25 DIAGNOSIS — M7918 Myalgia, other site: Secondary | ICD-10-CM | POA: Insufficient documentation

## 2014-12-25 DIAGNOSIS — M791 Myalgia: Secondary | ICD-10-CM | POA: Diagnosis not present

## 2014-12-25 HISTORY — DX: Myalgia, other site: M79.18

## 2014-12-25 HISTORY — DX: Headache, unspecified: R51.9

## 2014-12-25 MED ORDER — METHOCARBAMOL 500 MG PO TABS: 500.0000 mg | ORAL_TABLET | Freq: Four times a day (QID) | ORAL | Status: DC

## 2014-12-25 NOTE — Patient Instructions (Addendum)
   We will try to start Robaxin as a muscle relaxant, and get you into physical therapy for the headache pain.  Myofascial Pain Syndrome Myofascial pain syndrome is a pain disorder. This pain may be felt in the muscles. It may come and go. Myofascial pain syndrome always has trigger or tender points in the muscle that will cause pain when pressed.  CAUSES Myofascial pain may be caused by injuries, especially auto accidents, or by overuse of certain muscles. Typically the pain is long lasting. It is made worse by overuse of the involved muscles, emotional distress, and by cold, damp weather. Myofascial pain syndrome often develops in patients whose response to stress is an increase in muscle tone, and is seen in greater frequency in patients with pre-existing tension headaches. SYMPTOMS  Myofascial pain syndrome causes a wide variety of symptoms. You may see tight ropy bands of muscle. Problems may also include aching, cramping, burning, numbness, tingling, and other uncomfortable sensations in muscular areas. It most commonly affects the neck, upper back, and shoulder areas. Pain often radiates into the arms and hands.  TREATMENT Treatment includes resting the affected muscular area and applying ice packs to reduce spasm and pain. Trigger point injection, is a valuable initial therapy. This therapy is an injection of local anesthetic directly into the trigger point. Trigger points are often present at the source of pain. Pain relief following injection confirms the diagnosis of myofascial pain syndrome. Fairly vigorous therapy can be carried out during the pain-free period after each injection. Stretching exercises to loosen up the muscles are also useful. Transcutaneous electrical nerve stimulation (TENS) may provide relief from pain. TENS is the use of electric current produced by a device to stimulate the nerves. Ultrasound therapy applied directly over the affected muscle may also provide pain relief.  Anti-inflammatory pain medicine can be helpful. Symptoms will gradually improve over a period of weeks to months with proper treatment. HOME CARE INSTRUCTIONS Call your caregiver for follow-up care as recommended.  SEEK MEDICAL CARE IF:  Your pain is severe and not helped with medications. Document Released: 04/23/2004 Document Revised: 06/08/2011 Document Reviewed: 05/02/2010 [facility] Patient Information 2015 [place], [place]. This information is not intended to replace advice given to you by your health care provider. Make sure you discuss any questions you have with your health care provider.
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
## 2015-02-05 ENCOUNTER — Ambulatory Visit: Payer: Medicaid Other | Attending: Neurology | Admitting: Rehabilitative and Restorative Service Providers"

## 2015-02-05 DIAGNOSIS — Z9889 Other specified postprocedural states: Secondary | ICD-10-CM | POA: Diagnosis present

## 2015-02-05 DIAGNOSIS — M791 Myalgia: Secondary | ICD-10-CM | POA: Diagnosis present

## 2015-02-05 DIAGNOSIS — M7918 Myalgia, other site: Secondary | ICD-10-CM

## 2015-02-05 DIAGNOSIS — Z8603 Personal history of neoplasm of uncertain behavior: Secondary | ICD-10-CM

## 2015-02-05 NOTE — Patient Instructions (Signed)
Face Exercise, Open Mouth    Open mouth as wide as possible, then close lips as tightly as possible. Repeat __10__ times. Do __2__ sessions per day.  Copyright  VHI. All rights reserved.  Feeding: Tongue Movements    Place small amount of food such as peanut butter in corner of mouth to encourage child to move tongue over and out to lick off. Practice to both sides as well as upper and lower lips. Do each direction 5 times, 2 times per day as able.  Copyright  VHI. All rights reserved.  Healthy Back - Shoulder Roll    Stand straight with arms relaxed at sides. Roll shoulders backward continuously. Do _10__ times.  This exercise can also be done one shoulder at a time.  Copyright  VHI. All rights reserved.  Thoracic Self-Mobilization (Supine)   NO TOWEL TO START * With rolled towel placed lengthwise at lower ribs level, lie back on towel with arms outstretched. Hold __2 MINUTES. Relax. Repeat _1___ times. Do __2__ sessions per day.  http://orth.exer.us/1001   Copyright  VHI. All rights reserved.
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
## 2015-02-12 ENCOUNTER — Telehealth: Payer: Self-pay

## 2015-02-12 NOTE — Telephone Encounter (Signed)
Called patient to cancel appt on 12/28 and offer earlier appt due to NP-Megan being on vacation. No answer.  

## 2015-02-14 NOTE — Telephone Encounter (Signed)
Spoke to patient. R/s appt.  

## 2015-03-12 ENCOUNTER — Ambulatory Visit (INDEPENDENT_AMBULATORY_CARE_PROVIDER_SITE_OTHER): Payer: Medicaid Other | Admitting: Adult Health

## 2015-03-12 ENCOUNTER — Encounter: Payer: Self-pay | Admitting: Adult Health

## 2015-03-12 VITALS — BP 131/90 | HR 82 | Ht 59.0 in | Wt 124.5 lb

## 2015-03-12 DIAGNOSIS — M791 Myalgia: Secondary | ICD-10-CM | POA: Diagnosis not present

## 2015-03-12 DIAGNOSIS — Z86018 Personal history of other benign neoplasm: Secondary | ICD-10-CM | POA: Diagnosis not present

## 2015-03-12 DIAGNOSIS — M7918 Myalgia, other site: Secondary | ICD-10-CM

## 2015-03-12 MED ORDER — PREGABALIN 50 MG PO CAPS: 50.0000 mg | ORAL_CAPSULE | Freq: Every day | ORAL | Status: DC

## 2015-03-12 NOTE — Patient Instructions (Signed)
Continue Robaxin Add Lyrica 50 mg at bedtime If your symptoms worsen or you develop new symptoms please let us know.   Pregabalin capsules What is this medicine? PREGABALIN (pre GAB a lin) is used to treat nerve pain from diabetes, shingles, spinal cord injury, and fibromyalgia. It is also used to control seizures in epilepsy. This medicine may be used for other purposes; ask your health care provider or pharmacist if you have questions. What should I tell my health care provider before I take this medicine? They need to know if you have any of these conditions: -bleeding problems -heart disease, including heart failure -history of alcohol or drug abuse -kidney disease -suicidal thoughts, plans, or attempt; a previous suicide attempt by you or a family member -an unusual or allergic reaction to pregabalin, gabapentin, other medicines, foods, dyes, or preservatives -pregnant or trying to get pregnant or trying to conceive with your partner -breast-feeding How should I use this medicine? Take this medicine by mouth with a glass of water. Follow the directions on the prescription label. You can take this medicine with or without food. Take your doses at regular intervals. Do not take your medicine more often than directed. Do not stop taking except on your doctor's advice. A special MedGuide will be given to you by the pharmacist with each prescription and refill. Be sure to read this information carefully each time. Talk to your pediatrician regarding the use of this medicine in children. Special care may be needed. Overdosage: If you think you have taken too much of this medicine contact a poison control center or emergency room at once. NOTE: This medicine is only for you. Do not share this medicine with others. What if I miss a dose? If you miss a dose, take it as soon as you can. If it is almost time for your next dose, take only that dose. Do not take double or extra doses. What may  interact with this medicine? -alcohol -certain medicines for blood pressure like captopril, enalapril, or lisinopril -certain medicines for diabetes, like pioglitazone or rosiglitazone -certain medicines for anxiety or sleep -narcotic medicines for pain This list may not describe all possible interactions. Give your health care provider a list of all the medicines, herbs, non-prescription drugs, or dietary supplements you use. Also tell them if you smoke, drink alcohol, or use illegal drugs. Some items may interact with your medicine. What should I watch for while using this medicine? Tell your doctor or healthcare professional if your symptoms do not start to get better or if they get worse. Visit your doctor or health care professional for regular checks on your progress. Do not stop taking except on your doctor's advice. You may develop a severe reaction. Your doctor will tell you how much medicine to take. Wear a medical identification bracelet or chain if you are taking this medicine for seizures, and carry a card that describes your disease and details of your medicine and dosage times. You may get drowsy or dizzy. Do not drive, use machinery, or do anything that needs mental alertness until you know how this medicine affects you. Do not stand or sit up quickly, especially if you are an older patient. This reduces the risk of dizzy or fainting spells. Alcohol may interfere with the effect of this medicine. Avoid alcoholic drinks. If you have a heart condition, like congestive heart failure, and notice that you are retaining water and have swelling in your hands or feet, contact your health care  provider immediately. The use of this medicine may increase the chance of suicidal thoughts or actions. Pay special attention to how you are responding while on this medicine. Any worsening of mood, or thoughts of suicide or dying should be reported to your health care professional right away. This medicine  has caused reduced sperm counts in some men. This may interfere with the ability to father a child. You should talk to your doctor or health care professional if you are concerned about your fertility. Women who become pregnant while using this medicine for seizures may enroll in the [place] Pregnancy Registry by calling 9898000342. This registry collects information about the safety of antiepileptic drug use during pregnancy. What side effects may I notice from receiving this medicine? Side effects that you should report to your doctor or health care professional as soon as possible: -allergic reactions like skin rash, itching or hives, swelling of the face, lips, or tongue -breathing problems -changes in vision -chest pain -confusion -jerking or unusual movements of any part of your body -loss of memory -muscle pain, tenderness, or weakness -suicidal thoughts or other mood changes -swelling of the ankles, feet, hands -unusual bruising or bleeding Side effects that usually do not require medical attention (Report these to your doctor or health care professional if they continue or are bothersome.): -dizziness -drowsiness -dry mouth -headache -nausea -tremors -trouble sleeping -weight gain This list may not describe all possible side effects. Call your doctor for medical advice about side effects. You may report side effects to FDA at 1-800-FDA-1088. Where should I keep my medicine? Keep out of the reach of children. This medicine can be abused. Keep your medicine in a safe place to protect it from theft. Do not share this medicine with anyone. Selling or giving away this medicine is dangerous and against the law. This medicine may cause accidental overdose and death if it taken by other adults, children, or pets. Mix any unused medicine with a substance like cat litter or coffee grounds. Then throw the medicine away in a sealed container like a sealed bag or  a coffee can with a lid. Do not use the medicine after the expiration date. Store at room temperature between 15 and 30 degrees C (59 and 86 degrees F). NOTE: This sheet is a summary. It may not cover all possible information. If you have questions about this medicine, talk to your doctor, pharmacist, or health care provider.    2016, Elsevier/Gold Standard. (2013-05-12 15:38:53)

## 2015-03-12 NOTE — Progress Notes (Signed)
I have read the note, and I agree with the clinical assessment and plan.  Melanie Dillon KEITH   
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
## 2015-03-27 ENCOUNTER — Ambulatory Visit: Payer: Medicaid Other | Admitting: Adult Health

## 2015-04-03 ENCOUNTER — Other Ambulatory Visit: Payer: Self-pay

## 2015-04-03 ENCOUNTER — Telehealth: Payer: Self-pay | Admitting: Adult Health

## 2015-04-03 MED ORDER — PREGABALIN 50 MG PO CAPS: 50.0000 mg | ORAL_CAPSULE | Freq: Two times a day (BID) | ORAL | Status: DC

## 2015-04-03 NOTE — Telephone Encounter (Signed)
Patient states she has increased Lyrica from one daily to one twice daily, and would like a Rx to reflect this change.  Thank you.

## 2015-04-03 NOTE — Telephone Encounter (Signed)
Thank you! Rx has been updated in chart.  Forwarded for signature.

## 2015-04-03 NOTE — Telephone Encounter (Signed)
Ok to increase. thanks

## 2015-04-03 NOTE — Telephone Encounter (Signed)
Patient is calling to get a new Rx for pregabalin (LYRICA) 50 MG capsule since she now takes twice a day. Please call to [facility] on [address] in [place]. Thank you.

## 2015-04-08 ENCOUNTER — Other Ambulatory Visit: Payer: Self-pay | Admitting: Neurology

## 2015-06-13 ENCOUNTER — Ambulatory Visit (INDEPENDENT_AMBULATORY_CARE_PROVIDER_SITE_OTHER): Payer: Medicaid Other | Admitting: Adult Health

## 2015-06-13 ENCOUNTER — Encounter: Payer: Self-pay | Admitting: Adult Health

## 2015-06-13 VITALS — BP 132/80 | HR 76 | Resp 20 | Ht 59.5 in | Wt 126.0 lb

## 2015-06-13 DIAGNOSIS — M791 Myalgia: Secondary | ICD-10-CM

## 2015-06-13 DIAGNOSIS — M7918 Myalgia, other site: Secondary | ICD-10-CM

## 2015-06-13 MED ORDER — PREGABALIN 50 MG PO CAPS: 50.0000 mg | ORAL_CAPSULE | Freq: Two times a day (BID) | ORAL | Status: DC

## 2015-06-13 NOTE — Patient Instructions (Signed)
Restart lyrica 50 mg twice a day Continue Robaxin If your symptoms worsen or you develop new symptoms please let us know.
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
## 2015-06-13 NOTE — Progress Notes (Signed)
I have read the note, and I agree with the clinical assessment and plan.  Ashanti Littles KEITH   

## 2015-07-17 ENCOUNTER — Telehealth: Payer: Self-pay | Admitting: Adult Health

## 2015-07-17 NOTE — Telephone Encounter (Signed)
The patient is calling. She has been taking balin (LYRICA) 50 MG capsule for pain in her head but says it is not helping with the pain. Can something else be called in or increase the dosage of pregabalin (LYRICA) 50 MG capsule. Please call and advise. The patient uses [facility] in [place]. Thank you.

## 2015-07-18 MED ORDER — PREGABALIN 50 MG PO CAPS: 50.0000 mg | ORAL_CAPSULE | Freq: Three times a day (TID) | ORAL | Status: DC

## 2015-07-18 NOTE — Telephone Encounter (Signed)
RX for lyrica faxed to [facility] in [place]. Received a receipt of confirmation.

## 2015-07-18 NOTE — Telephone Encounter (Signed)
I called the patient. She is still having discomfort in the face. I will increase Lyrica to 50 mg 3 times a day. Patient is amenable to this plan. She will call if this is not beneficial.

## 2015-09-04 ENCOUNTER — Other Ambulatory Visit: Payer: Self-pay | Admitting: Neurology

## 2015-10-16 ENCOUNTER — Ambulatory Visit (INDEPENDENT_AMBULATORY_CARE_PROVIDER_SITE_OTHER): Payer: Medicaid Other | Admitting: Adult Health

## 2015-10-16 ENCOUNTER — Encounter: Payer: Self-pay | Admitting: Adult Health

## 2015-10-16 VITALS — BP 115/80 | HR 82 | Wt 127.4 lb

## 2015-10-16 DIAGNOSIS — M7918 Myalgia, other site: Secondary | ICD-10-CM

## 2015-10-16 DIAGNOSIS — Z8669 Personal history of other diseases of the nervous system and sense organs: Secondary | ICD-10-CM

## 2015-10-16 DIAGNOSIS — M791 Myalgia: Secondary | ICD-10-CM | POA: Diagnosis not present

## 2015-10-16 DIAGNOSIS — Z86011 Personal history of benign neoplasm of the brain: Secondary | ICD-10-CM

## 2015-10-16 MED ORDER — DULOXETINE HCL 30 MG PO CPEP: ORAL_CAPSULE | ORAL | Status: DC

## 2015-10-16 MED ORDER — METHOCARBAMOL 500 MG PO TABS: 500.0000 mg | ORAL_TABLET | Freq: Four times a day (QID) | ORAL | Status: DC

## 2015-10-16 NOTE — Progress Notes (Signed)
I have read the note, and I agree with the clinical assessment and plan.  WILLIS,CHARLES KEITH   

## 2015-10-16 NOTE — Patient Instructions (Addendum)
Continue Lyrica 50 mg three times a day Restart Cymbalta 30 mg at bedtime for 2 week then increase to 1 tablet twice a day Get MRI brain scheduled If your symptoms worsen or you develop new symptoms please let us know.   Cymbalta and tramadol can increase risk for serotonin syndrome. Severe serotonin syndrome can be life-threatening emergency. Signs and symptoms of a severe reaction may include: high fever, seizures, irregular heartbeat, unconsciousness or altered level of awareness or personality changes. If you have any of these new symptoms, call 911 or have someone take you to the emergency room.
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
## 2015-11-21 ENCOUNTER — Other Ambulatory Visit: Payer: Self-pay | Admitting: Radiation Therapy

## 2015-11-21 DIAGNOSIS — D329 Benign neoplasm of meninges, unspecified: Secondary | ICD-10-CM

## 2015-11-22 ENCOUNTER — Encounter: Payer: Self-pay | Admitting: Radiation Therapy

## 2015-11-22 NOTE — Progress Notes (Signed)
1.  Do you need a wheel chair?   No   2. On oxygen? No  3. Have you ever had any surgery in the body part being scanned?Yes, Meningioma removal on 05/11/14 by Dr. Kathyrn Sheriff   4. Have you ever had any surgery on your brain or heart?   Brain, see above                                                 5. Have you ever had surgery on your eyes or ears?  No                                                   6. Do you have a pacemaker or defibrillator?   No  7. Do you have a Neurostimulator?  No  8. Claustrophobic?  No  9. Any risk for metal in eyes?  No  10. Injury by bullet, buckshot, or shrapnel?  No  11. Stent?  No 12. Hx of Cancer?   No                                                                                                     13. Kidney or Liver disease?  No  14. Hx of Lupus, Rheumatoid Arthritis or Scleroderma?  No  15. IV Antibiotics or long term use of NSAIDS?  No  16. HX of Hypertension?  No  17. Diabetes?  No  18. Allergy to contrast?  No  19. Recent labs. Based on age she would not need labs prior to this scan.   Questions answered by pt over the phone on 8/25 @ 10:55am  Mont Dutton R.T.(R)(T) Special Procedures Navigator

## 2015-11-22 NOTE — Progress Notes (Signed)
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  Patient presented with symptoms of:  She initially presented to the outpatient neurosurgery clinic with right-sided eye pain and proptosis in early 2016.  Past or anticipated interventions, if any, per neurosurgery:  05/11/14 PROCEDURE: 1. Right orbital frontotemporal craniotomy for resection of sphenoid tumor 2. Use of intraoperative microscope for microdissection 3. Use of intraoperative stereotaxy  SURGEON: Dr. Consuella Lose, MD  Past or anticipated interventions, if any, per medical oncology: None  Dose of Decadron, if applicable: No  Recent neurologic symptoms, if any:   Seizures: No  Headaches: She has chronic right anterior headache since her Craniotomy 05/11/14.   Nausea: No  Dizziness/ataxia: No  Difficulty with hand coordination:  No  Focal numbness/weakness: No  Visual deficits/changes: No  Confusion/Memory deficits: She tells me she has had poor memory since her surgery.     SAFETY ISSUES:  Prior radiation? No  Pacemaker/ICD? No  Possible current pregnancy? No. She is using Depo as birth control. She does not have periods.   Is the patient on methotrexate? No Additional Complaints / other details:  MRI Brain 12/03/15  BP 123/88   Pulse 72   Temp 98.5 F (36.9 C)   Ht 4' 11.5" (1.511 m)   Wt 126 lb 11.2 oz (57.5 kg)   SpO2 99% Comment: room air  BMI 25.16 kg/m    Wt Readings from Last 3 Encounters:  11/29/15 126 lb 11.2 oz (57.5 kg)  10/16/15 127 lb 6.4 oz (57.8 kg)  06/13/15 126 lb (57.2 kg)

## 2015-11-27 ENCOUNTER — Encounter: Payer: Self-pay | Admitting: Adult Health

## 2015-11-29 ENCOUNTER — Ambulatory Visit
Admission: RE | Admit: 2015-11-29 | Discharge: 2015-11-29 | Disposition: A | Discharge status: Home / Self Care | Payer: Medicaid Other | Source: Ambulatory Visit | Attending: Radiation Oncology | Admitting: Radiation Oncology

## 2015-11-29 ENCOUNTER — Encounter: Payer: Self-pay | Admitting: Radiation Oncology

## 2015-11-29 VITALS — BP 123/88 | HR 72 | Temp 98.5°F | Ht 59.5 in | Wt 126.7 lb

## 2015-11-29 DIAGNOSIS — Z510 Encounter for antineoplastic radiation therapy: Secondary | ICD-10-CM | POA: Insufficient documentation

## 2015-11-29 DIAGNOSIS — M1990 Unspecified osteoarthritis, unspecified site: Secondary | ICD-10-CM | POA: Diagnosis not present

## 2015-11-29 DIAGNOSIS — Z8249 Family history of ischemic heart disease and other diseases of the circulatory system: Secondary | ICD-10-CM | POA: Insufficient documentation

## 2015-11-29 DIAGNOSIS — Z888 Allergy status to other drugs, medicaments and biological substances status: Secondary | ICD-10-CM | POA: Diagnosis not present

## 2015-11-29 DIAGNOSIS — Z833 Family history of diabetes mellitus: Secondary | ICD-10-CM | POA: Diagnosis not present

## 2015-11-29 DIAGNOSIS — D320 Benign neoplasm of cerebral meninges: Secondary | ICD-10-CM | POA: Diagnosis not present

## 2015-11-29 DIAGNOSIS — H0520 Unspecified exophthalmos: Secondary | ICD-10-CM | POA: Diagnosis not present

## 2015-11-29 DIAGNOSIS — J45909 Unspecified asthma, uncomplicated: Secondary | ICD-10-CM | POA: Diagnosis not present

## 2015-11-29 DIAGNOSIS — Z79899 Other long term (current) drug therapy: Secondary | ICD-10-CM | POA: Diagnosis not present

## 2015-11-29 DIAGNOSIS — F17210 Nicotine dependence, cigarettes, uncomplicated: Secondary | ICD-10-CM | POA: Insufficient documentation

## 2015-11-29 DIAGNOSIS — Z91040 Latex allergy status: Secondary | ICD-10-CM | POA: Insufficient documentation

## 2015-11-29 DIAGNOSIS — D329 Benign neoplasm of meninges, unspecified: Secondary | ICD-10-CM

## 2015-11-29 DIAGNOSIS — M791 Myalgia: Secondary | ICD-10-CM | POA: Diagnosis not present

## 2015-11-29 NOTE — Progress Notes (Addendum)
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 female who has been discussed at tumor board and the patient showed sings of mild tumor progression on MRI from 11/01/15. The consensus at tumor board is that the patient would be appropriate candidate for salvage post-operative radiotherapy. She was felt to have had a subtotal resection in February 2016. At that time, Dr. Kathyrn Sheriff resected a 2.5 cm tumor considered to be a Grade I meningioma with benign bone involvement. This was resected by right orbital fronto-temporal craniotomy, resected from the site of the right sphenoid bone.  PREVIOUS RADIATION THERAPY: No  PAST MEDICAL HISTORY:  has a past medical history of Arthritis; Asthma; Chronic back pain greater than 3 months duration (2014); Headache disorder (12/25/2014); and Myofascial pain syndrome (12/25/2014).    PAST SURGICAL HISTORY: Past Surgical History:  Procedure Laterality Date  . CHOLECYSTECTOMY    . CRANIOTOMY Right 05/11/2014   Procedure: Right Frontal Temporal Craniotomy for resection of meningioma;  Surgeon: Consuella Lose, MD;  Location: [facility];  Service: Neurosurgery;  Laterality: Right;  Right Frontal Temporal Craniotomy for resection of meningioma    FAMILY HISTORY: family history includes Diabetes in her father, maternal grandfather, maternal grandmother, mother, paternal grandfather, and paternal grandmother; Heart disease in her father, maternal grandfather, maternal grandmother, mother, paternal grandfather, and paternal grandmother; Hypertension in her father, maternal grandfather,  maternal grandmother, mother, paternal grandfather, and paternal grandmother.  SOCIAL HISTORY:  reports that she has been smoking Cigarettes.  She has a 20.00 pack-year smoking history. She has never used smokeless tobacco. She reports that she drinks alcohol. She reports that she does not use drugs.  ALLERGIES: Latex; Mobic [meloxicam]; and Gabapentin  MEDICATIONS:  Current Outpatient Prescriptions  Medication Sig Dispense Refill  . albuterol (PROVENTIL HFA;VENTOLIN HFA) 108 (90 BASE) MCG/ACT inhaler Inhale 2 puffs into the lungs every 6 (six) hours as needed for wheezing or shortness of breath.    . DULoxetine (CYMBALTA) 30 MG capsule Take 1 tablet PO daily for 2 weeks then increase to 1 tablet BID therafter 60 capsule 3  . ibuprofen (ADVIL,MOTRIN) 800 MG tablet Take 800 mg by mouth 3 (three) times daily.     . medroxyPROGESTERone (DEPO-PROVERA) 150 MG/ML injection Inject 1 mL into the muscle every 3 (three) months.  3  . methocarbamol (ROBAXIN) 500 MG tablet Take 1 tablet (500 mg total) by mouth 4 (four) times daily. 120 tablet 3  . pregabalin (LYRICA) 50 MG capsule Take 1 capsule (50 mg total) by mouth 3 (three) times daily. 90 capsule 3  . rOPINIRole (REQUIP) 0.5 MG tablet Take 0.5 mg by mouth at bedtime.  3  . traMADol (ULTRAM) 50 MG tablet Take 50 mg by mouth every 6 (six) hours as needed.  2   No current facility-administered medications for this encounter.     REVIEW OF SYSTEMS:  Prior to diagnosis, the patient had a constant headache in the right temple and then proptosis. She continues to have right temporal/frontal headaches post-operatively. She also has right jaw tightness.   PHYSICAL EXAM:  height is 4' 11.5" (1.511 m) and weight is 126 lb 11.2 oz (  57.5 kg). Her temperature is 98.5 F (36.9 C). Her blood pressure is 123/88 and her pulse is 72. Her oxygen saturation is 99%.   General: Alert and oriented, in no acute distress HEENT: Head is normocephalic. Extraocular movements  are intact. Right sided proptosis. Oropharynx notable for poor dentition. Neck: Neck is supple, no palpable cervical or supraclavicular lymphadenopathy. Heart: Regular in rate and rhythm with no murmurs, rubs, or gallops. Chest: Wheezes in the right base of the lung. Abdomen: Soft, nontender, nondistended, with no rigidity or guarding. Extremities: No cyanosis or edema. Lymphatics: see Neck Exam Skin: No concerning lesions. Musculoskeletal: symmetric strength and muscle tone throughout. Neurologic: Cranial nerves II through XII are grossly intact. No obvious focalities. Speech is fluent. Coordination is intact. Vision grossly intact in all 4 quadrants. 2/3 object recall in 3 minutes. Psychiatric: Judgment and insight are intact. Affect is appropriate.  ECOG = 1  0 - Asymptomatic (Fully active, able to carry on all predisease activities without restriction)  1 - Symptomatic but completely ambulatory (Restricted in physically strenuous activity but ambulatory and able to carry out work of a light or sedentary nature. For example, light housework, office work)  2 - Symptomatic, <50% in bed during the day (Ambulatory and capable of all self care but unable to carry out any work activities. Up and about more than 50% of waking hours)  3 - Symptomatic, >50% in bed, but not bedbound (Capable of only limited self-care, confined to bed or chair 50% or more of waking hours)  4 - Bedbound (Completely disabled. Cannot carry on any self-care. Totally confined to bed or chair)  5 - Death   Eustace Pen MM, Creech RH, Tormey DC, et al. [phone]). "Toxicity and response criteria of the [facility]". [place] Oncol. 5 (6): 649-55   LABORATORY DATA:  Lab Results  Component Value Date   WBC 5.6 05/11/2014   HGB 12.1 05/11/2014   HCT 36.4 05/11/2014   MCV 95.3 05/11/2014   PLT 199 05/11/2014   CMP     Component Value Date/Time   NA 134 (L) 05/11/2014 1025   K 4.6 05/11/2014 1025    CL 105 05/01/2014 0930   CO2 27 05/01/2014 0930   GLUCOSE 70 05/01/2014 0930   BUN 11 05/01/2014 0930   CREATININE 0.99 05/11/2014 1449   CALCIUM 9.1 05/01/2014 0930   GFRNONAA 74 (L) 05/11/2014 1449   GFRAA 85 (L) 05/11/2014 1449    RADIOGRAPHY: No results found.    IMPRESSION/PLAN: Meningioma (WHO grade I) involving benign bone  We discussed stereotactic radiation for the management of her disease. We discussed the pros and cons of treatment. We discussed side effect of treatment; such as hair loss, memory loss, etc. The patient would like to proceed with treatment. The patient signed a consent form and this was placed in her medical chart.  I anticipate that based on the size and location of her disease, she will likely need fractionated RT over 6 weeks.  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 She would like to be treated here, but is worried about the cost of gas. I will refer her to social work regarding this.  She has a brain MRI scheduled on 12/03/15, but this is being contested by her insurance. We will contact her if this has been canceled. CT simulation is scheduled on 12/04/15 at 2:30PM and treatment to  begin on 12/11/15.  The patient continues to use tobacco. The patient was counseled to stop using tobacco and was offered pharmacotherapy and further counseling to help with this. The patient declined pharmacotherapy and further counseling at this time.   __________________________________________   Eppie Gibson, MD  This document serves as a record of services personally performed by Eppie Gibson, MD. It was created on her behalf by Darcus Austin, a trained medical scribe. The creation of this record is based on the scribe's personal observations and the provider's statements to them. This document has been checked and approved by the attending provider.

## 2015-12-03 ENCOUNTER — Other Ambulatory Visit: Payer: Self-pay | Admitting: Radiation Oncology

## 2015-12-03 ENCOUNTER — Inpatient Hospital Stay: Admission: RE | Admit: 2015-12-03 | Payer: Medicaid Other | Source: Ambulatory Visit

## 2015-12-03 DIAGNOSIS — D320 Benign neoplasm of cerebral meninges: Secondary | ICD-10-CM

## 2015-12-04 ENCOUNTER — Ambulatory Visit: Payer: Medicaid Other | Admitting: Radiation Oncology

## 2015-12-04 ENCOUNTER — Ambulatory Visit: Payer: Medicaid Other

## 2015-12-05 ENCOUNTER — Telehealth: Payer: Self-pay | Admitting: *Deleted

## 2015-12-05 NOTE — Telephone Encounter (Signed)
CALLED LAUREN MULLIS (SOCIAL WORKER) TO INFORM OF REFERRAL FOR THIS PATIENT, LVM FOR A RETURN CALL.

## 2015-12-11 ENCOUNTER — Ambulatory Visit: Payer: Medicaid Other | Admitting: Radiation Oncology

## 2015-12-11 ENCOUNTER — Other Ambulatory Visit: Payer: Self-pay | Admitting: Radiation Therapy

## 2015-12-11 DIAGNOSIS — D329 Benign neoplasm of meninges, unspecified: Secondary | ICD-10-CM

## 2015-12-12 ENCOUNTER — Inpatient Hospital Stay: Admission: RE | Admit: 2015-12-12 | Payer: Medicaid Other | Source: Ambulatory Visit

## 2015-12-12 ENCOUNTER — Ambulatory Visit
Admission: RE | Admit: 2015-12-12 | Discharge: 2015-12-12 | Disposition: A | Discharge status: Home / Self Care | Payer: Medicaid Other | Source: Ambulatory Visit | Attending: Radiation Oncology | Admitting: Radiation Oncology

## 2015-12-12 DIAGNOSIS — D329 Benign neoplasm of meninges, unspecified: Secondary | ICD-10-CM

## 2015-12-12 MED ORDER — GADOBENATE DIMEGLUMINE 529 MG/ML IV SOLN
11.0000 mL | Freq: Once | INTRAVENOUS | Status: AC | PRN
  Administered 2015-12-12: 11 mL via INTRAVENOUS

## 2015-12-13 ENCOUNTER — Ambulatory Visit: Payer: Medicaid Other

## 2015-12-13 ENCOUNTER — Ambulatory Visit: Payer: Medicaid Other | Admitting: Radiation Oncology

## 2015-12-16 ENCOUNTER — Ambulatory Visit: Admission: RE | Admit: 2015-12-16 | Payer: Medicaid Other | Source: Ambulatory Visit

## 2015-12-16 ENCOUNTER — Ambulatory Visit
Admission: RE | Admit: 2015-12-16 | Discharge: 2015-12-16 | Disposition: A | Discharge status: Home / Self Care | Payer: Medicaid Other | Source: Ambulatory Visit | Attending: Radiation Oncology | Admitting: Radiation Oncology

## 2015-12-16 DIAGNOSIS — D320 Benign neoplasm of cerebral meninges: Secondary | ICD-10-CM

## 2015-12-16 DIAGNOSIS — Z510 Encounter for antineoplastic radiation therapy: Secondary | ICD-10-CM | POA: Diagnosis not present

## 2015-12-16 NOTE — Progress Notes (Signed)
  Radiation Oncology         ([phone] ________________________________  Name: Melanie Dillon MRN: [id number]  Date: 12/16/2015  DOB: January 12, 1981  SIMULATION AND TREATMENT PLANNING NOTE  Outpatient  DIAGNOSIS:     ICD-9-CM ICD-10-CM   1. Benign meningioma of brain ([place]) 225.2 D32.0     NARRATIVE:  The patient was brought to the [facility].  Identity was confirmed.  All relevant records and images related to the planned course of therapy were reviewed.  The patient freely provided informed written consent to proceed with treatment after reviewing the details related to the planned course of therapy. The consent form was witnessed and verified by the simulation staff.    Then, the patient was set-up in a stable reproducible  supine position for radiation therapy.  CT images were obtained.  Surface markings were placed.  The CT images were loaded into the planning software.    TREATMENT PLANNING NOTE: Treatment planning then occurred.  The radiation prescription was entered and confirmed.    A total of 1 medically necessary complex treatment devices were fabricated and supervised by me, in the form of aquaplast mask for immobilization.    I have requested : Intensity Modulated Radiotherapy (IMRT) is medically necessary for this case for the following reason:  Critical CNS structure avoidance - brainstem, optic chiasm, optic nerve.. The patient will receive 52.2 Gy in 29 fractions to the right sphenoid meningioma.   -----------------------------------  Eppie Gibson, MD

## 2015-12-16 NOTE — Addendum Note (Signed)
Encounter addended by: Eppie Gibson, MD on: 12/16/2015  9:56 AM<BR>    Actions taken: Sign clinical note

## 2015-12-23 DIAGNOSIS — Z510 Encounter for antineoplastic radiation therapy: Secondary | ICD-10-CM | POA: Diagnosis not present

## 2015-12-24 ENCOUNTER — Encounter: Payer: Self-pay | Admitting: *Deleted

## 2015-12-24 NOTE — Progress Notes (Signed)
[place] Psychosocial Distress Screening Clinical Social Work  Clinical Social Work was referred by distress screening protocol.  The patient scored a 8 on the Psychosocial Distress Thermometer which indicates moderate distress. Clinical Social Worker contacted patient at home to assess for distress and other psychosocial needs.  CSW left patient a message offering support and information on the support team and support services at [facility].  CSW encouraged patient to call back with needs or concerns.      ONCBCN DISTRESS SCREENING 11/29/2015  Screening Type Initial Screening  Distress experienced in past week (1-10) 8  Emotional problem type Nervousness/Anxiety  Physical Problem type Pain;Sleep/insomnia  Physician notified of physical symptoms Yes    Johnnye Lana, MSW, LCSW, OSW-C Clinical Social Worker [place] [phone]

## 2015-12-25 ENCOUNTER — Ambulatory Visit
Admission: RE | Admit: 2015-12-25 | Discharge: 2015-12-25 | Disposition: A | Discharge status: Home / Self Care | Payer: Medicaid Other | Source: Ambulatory Visit | Attending: Radiation Oncology | Admitting: Radiation Oncology

## 2015-12-25 DIAGNOSIS — Z510 Encounter for antineoplastic radiation therapy: Secondary | ICD-10-CM | POA: Diagnosis not present

## 2015-12-25 DIAGNOSIS — D320 Benign neoplasm of cerebral meninges: Secondary | ICD-10-CM

## 2015-12-25 NOTE — Progress Notes (Signed)
IMRT Device Note    ICD-9-CM ICD-10-CM   1. Benign meningioma of brain (HCC) 225.2 D32.0     The patients IMRT treatment devices were approved. The code is [phone].  -----------------------------------  Eppie Gibson, MD

## 2015-12-26 ENCOUNTER — Ambulatory Visit
Admission: RE | Admit: 2015-12-26 | Discharge: 2015-12-26 | Disposition: A | Discharge status: Home / Self Care | Payer: Medicaid Other | Source: Ambulatory Visit | Attending: Radiation Oncology | Admitting: Radiation Oncology

## 2015-12-26 DIAGNOSIS — Z510 Encounter for antineoplastic radiation therapy: Secondary | ICD-10-CM | POA: Diagnosis not present

## 2015-12-26 DIAGNOSIS — D320 Benign neoplasm of cerebral meninges: Secondary | ICD-10-CM

## 2015-12-26 MED ORDER — SONAFINE EX EMUL
1.0000 "application " | Freq: Once | CUTANEOUS | Status: AC
  Administered 2015-12-26: 1 via TOPICAL

## 2015-12-26 NOTE — Progress Notes (Signed)
Pt here for patient teaching.  Pt given Radiation and You booklet, skin care instructions and Sonafine. Pt reports they have not watched the Radiation Therapy Education video, but were given the link to watch at home.  Reviewed areas of pertinence such as fatigue, hair loss, nausea and vomiting, skin changes, headache, blurry vision and taste changes . Pt able to give teach back of to pat skin, use unscented/gentle soap and drink plenty of water,apply Sonafine bid, avoid applying anything to skin within 4 hours of treatment and to use an electric razor if they must shave. Pt verbalizes understanding of information given and will contact nursing with any questions or concerns.     [email or web address]

## 2015-12-27 ENCOUNTER — Encounter: Payer: Self-pay | Admitting: *Deleted

## 2015-12-27 ENCOUNTER — Encounter: Payer: Self-pay | Admitting: Radiation Oncology

## 2015-12-27 ENCOUNTER — Ambulatory Visit
Admission: RE | Admit: 2015-12-27 | Discharge: 2015-12-27 | Disposition: A | Discharge status: Home / Self Care | Payer: Medicaid Other | Source: Ambulatory Visit | Attending: Radiation Oncology | Admitting: Radiation Oncology

## 2015-12-27 ENCOUNTER — Inpatient Hospital Stay
Admission: RE | Admit: 2015-12-27 | Discharge: 2015-12-27 | Disposition: A | Discharge status: Home / Self Care | Payer: Medicaid Other | Source: Ambulatory Visit | Attending: Radiation Oncology | Admitting: Radiation Oncology

## 2015-12-27 VITALS — BP 126/78 | HR 82 | Temp 98.1°F | Ht 59.5 in | Wt 127.1 lb

## 2015-12-27 DIAGNOSIS — Z510 Encounter for antineoplastic radiation therapy: Secondary | ICD-10-CM | POA: Diagnosis not present

## 2015-12-27 DIAGNOSIS — D320 Benign neoplasm of cerebral meninges: Secondary | ICD-10-CM

## 2015-12-27 MED ORDER — DEXAMETHASONE 2 MG PO TABS: 2.0000 mg | ORAL_TABLET | Freq: Three times a day (TID) | ORAL | 0 refills | Status: DC

## 2015-12-27 NOTE — Progress Notes (Signed)
[place] Work  Clinical Social Work was referred by Development worker, community for assessment of psychosocial needs due to transportation.  Clinical Social Worker contacted patient at home and left vm x2 to offer support and assess for needs.  Pt lives in [place] and reports to have issues with transportation. CSW reviewed on message that medicaid transportation could bring pt to her appts and that is her best option due to pt having benign tumor and not being eligible for additional cancer resources. CSW awaits return call.   Clinical Social Work interventions:  Resource education  Loren Racer, [place] Worker [place]  [place] Phone: [phone] Fax: [phone]

## 2015-12-27 NOTE — Progress Notes (Signed)
   Weekly Management Note: Outpatient    ICD-9-CM ICD-10-CM   1. Benign meningioma of brain (HCC) 225.2 D32.0 dexamethasone (DECADRON) 2 MG tablet    Current Dose:  5.4 Gy  Projected Dose: 52.2 Gy   Narrative:  The patient presents for routine under treatment assessment.  CBCT/MVCT images/Port film x-rays were reviewed.  The chart was checked.   Patient reports a frontal headache that radiates backwards. She has had this pain since surgery, but reports it has gotten worse since starting radiation. She also reports pain to the area in front of her right ear. She rates this pain 7/10 and notes it started last night. She is taking 800 mg of Ibuprofen three times daily with no relief. She otherwise feels like she is doing well, but does have some mild fatigue which she has had since her diagnosis, chronically.  Physical Findings:  height is 4' 11.5" (1.511 m) and weight is 127 lb 1.6 oz (57.7 kg). Her temperature is 98.1 F (36.7 C). Her blood pressure is 126/78 and her pulse is 82. Her oxygen saturation is 100%.   Wt Readings from Last 3 Encounters:  12/27/15 127 lb 1.6 oz (57.7 kg)  12/12/15 126 lb (57.2 kg)  11/29/15 126 lb 11.2 oz (57.5 kg)  NAD  Impression:  The patient is tolerating radiotherapy.  Plan:  Continue radiotherapy as planned. Prescriped dexamethasone for headaches, 2mg  TID, take with food.  Discussed risks and benefits of this. She would like to try it   ________________________________   Eppie Gibson, M.D.   This document serves as a record of services personally performed by Eppie Gibson, MD. It was created on her behalf by Bethann Humble, a trained medical scribe. The creation of this record is based on the scribe's personal observations and the provider's statements to them. This document has been checked and approved by the attending provider.

## 2015-12-27 NOTE — Progress Notes (Signed)
Melanie Dillon is here after her 3rd fraction of radiation to her Right Meningioma. She reports a frontal headache which radiates backwards. She has had this pain since surgery, but reports it has gotten worse since starting radiation. She also reports a pain to the area in front of her Right ear. She rates this a 7/10 and tells me it started last night. She is taking 800 mg of ibuprofen three times a day, with no relief. She otherwise feels like she is doing well, but does have some mild fatigue which she has had chronically since her diagnosis.  BP 126/78   Pulse 82   Temp 98.1 F (36.7 C)   Ht 4' 11.5" (1.511 m)   Wt 127 lb 1.6 oz (57.7 kg)   SpO2 100% Comment: room air  BMI 25.24 kg/m    Wt Readings from Last 3 Encounters:  12/27/15 127 lb 1.6 oz (57.7 kg)  12/12/15 126 lb (57.2 kg)  11/29/15 126 lb 11.2 oz (57.5 kg)

## 2015-12-30 ENCOUNTER — Ambulatory Visit
Admission: RE | Admit: 2015-12-30 | Discharge: 2015-12-30 | Disposition: A | Discharge status: Home / Self Care | Payer: Medicaid Other | Source: Ambulatory Visit | Attending: Radiation Oncology | Admitting: Radiation Oncology

## 2015-12-30 ENCOUNTER — Encounter: Payer: Self-pay | Admitting: *Deleted

## 2015-12-30 ENCOUNTER — Telehealth: Payer: Self-pay | Admitting: *Deleted

## 2015-12-30 VITALS — BP 125/90 | HR 64 | Temp 98.0°F | Resp 12 | Wt 127.6 lb

## 2015-12-30 DIAGNOSIS — D320 Benign neoplasm of cerebral meninges: Secondary | ICD-10-CM

## 2015-12-30 DIAGNOSIS — Z510 Encounter for antineoplastic radiation therapy: Secondary | ICD-10-CM | POA: Diagnosis not present

## 2015-12-30 DIAGNOSIS — R51 Headache: Secondary | ICD-10-CM

## 2015-12-30 DIAGNOSIS — R519 Headache, unspecified: Secondary | ICD-10-CM

## 2015-12-30 NOTE — Progress Notes (Signed)
PAIN: She rates her pain as a 5 on a scale of 0-10, constant, sharp shooting pain over frontal and right side of head. Ibuprofen for pain.   NEURO: Pt alert & oriented x 3 with fluent speech, gait normal- slow, reflexes normal and symmetric. Pt reports positive for eye pain right eye, occasional blurring PERRLA, recurrent seashells and ringing sound in right ear- last event was a few weeks ago. Pt presenting appropriate quality, quantity and organization of sentences. Pt reports sharp pain, bilateral in the frontal area. SKIN: Warm dry and intact.  BP 125/90   Pulse 64   Temp 98 F (36.7 C) (Oral)   Resp 12   Wt 127 lb 9.6 oz (57.9 kg)   SpO2 100%   BMI 25.34 kg/m  Wt Readings from Last 3 Encounters:  12/30/15 127 lb 9.6 oz (57.9 kg)  12/27/15 127 lb 1.6 oz (57.7 kg)  12/12/15 126 lb (57.2 kg)

## 2015-12-30 NOTE — Telephone Encounter (Signed)
CALLED PATIENT TO INFORM OF LAB ON 12-31-15 @ 11:15 AM AFTER HER RAD. TX., LVM FOR A RETURN CALL

## 2015-12-30 NOTE — Progress Notes (Addendum)
   Weekly Management Note: Outpatient    ICD-9-CM ICD-10-CM   1. Benign meningioma of brain (HCC) 225.2 D32.0 Comprehensive metabolic panel    Current Dose:  7.2 Gy  Projected Dose: 52.2 Gy   Narrative:  The patient presents for routine under treatment assessment.  CBCT/MVCT images/Port film x-rays were reviewed.  The chart was checked. No change to HAs on Decadron.  Has been taking ibuprofen 800mg  TID for a chronic period of time.  Physical Findings:  weight is 127 lb 9.6 oz (57.9 kg). Her oral temperature is 98 F (36.7 C). Her blood pressure is 125/90 and her pulse is 64. Her respiration is 12 and oxygen saturation is 100%.   Wt Readings from Last 3 Encounters:  12/30/15 127 lb 9.6 oz (57.9 kg)  12/27/15 127 lb 1.6 oz (57.7 kg)  12/12/15 126 lb (57.2 kg)  NAD. No thrush  Impression:  The patient is tolerating radiotherapy.  Plan:  Continue radiotherapy as planned. Ordered CMP to check liver function to see if Tylenol can address Chronic HAs. Pt has neuro consult in early Nov. Will also referral to pain clinic.  Advised to stop Ibuprofen due to risks to kidneys, stomach - also, it doesn't help her HAs. Stop Decadron, as it isn't helping either   .______________________________   Eppie Gibson, M.D.   This document serves as a record of services personally performed by Eppie Gibson, MD. It was created on her behalf by Bethann Humble, a trained medical scribe. The creation of this record is based on the scribe's personal observations and the provider's statements to them. This document has been checked and approved by the attending provider.

## 2015-12-30 NOTE — Progress Notes (Signed)
[place] Work  Clinical Social Work contacted patient at home to follow up regarding transportation.  Patient stated she was familiar with Medicaid transportation and has used them in the past.  Patient is currently driving herself to treatment.  Patient stated she was comfortable using Medicaid transportation as a "back up plan" if needed.   Johnnye Lana, MSW, LCSW, OSW-C Clinical Social Worker [facility] [phone]

## 2015-12-31 ENCOUNTER — Ambulatory Visit
Admission: RE | Admit: 2015-12-31 | Discharge: 2015-12-31 | Disposition: A | Discharge status: Home / Self Care | Payer: Medicaid Other | Source: Ambulatory Visit | Attending: Radiation Oncology | Admitting: Radiation Oncology

## 2015-12-31 ENCOUNTER — Telehealth: Payer: Self-pay

## 2015-12-31 DIAGNOSIS — Z510 Encounter for antineoplastic radiation therapy: Secondary | ICD-10-CM | POA: Diagnosis not present

## 2015-12-31 LAB — COMPREHENSIVE METABOLIC PANEL
ALK PHOS: 110 U/L (ref 40–150)
ALT: 12 U/L (ref 0–55)
ANION GAP: 9 meq/L (ref 3–11)
AST: 9 U/L (ref 5–34)
Albumin: 3.5 g/dL (ref 3.5–5.0)
BUN: 13.8 mg/dL (ref 7.0–26.0)
CO2: 25 meq/L (ref 22–29)
Calcium: 8.7 mg/dL (ref 8.4–10.4)
Chloride: 105 mEq/L (ref 98–109)
Creatinine: 0.8 mg/dL (ref 0.6–1.1)
Glucose: 94 mg/dl (ref 70–140)
POTASSIUM: 3.7 meq/L (ref 3.5–5.1)
Sodium: 139 mEq/L (ref 136–145)
TOTAL PROTEIN: 6.8 g/dL (ref 6.4–8.3)

## 2015-12-31 NOTE — Telephone Encounter (Signed)
I called and spoke to Ms. Melanie Dillon. I informed her that her liver function looked normal. She can take 1000 mg of Tylenol three times daily, but should not take more than that. Ms. Melanie Dillon asked if she should still take ibuprofen. I advised that per Dr. Pearlie Oyster note from yesterday (12/30/15) that she should stop taking ibuprofen and decadron since they were not helping her headache. Ms. Melanie Dillon voiced her understanding. She did ask about a referral to a pain clinic, and I told her that we were currently working on scheduling that for her. She is aware that she can call me with any other concerns or questions.

## 2016-01-01 ENCOUNTER — Ambulatory Visit
Admission: RE | Admit: 2016-01-01 | Discharge: 2016-01-01 | Disposition: A | Discharge status: Home / Self Care | Payer: Medicaid Other | Source: Ambulatory Visit | Attending: Radiation Oncology | Admitting: Radiation Oncology

## 2016-01-01 DIAGNOSIS — Z510 Encounter for antineoplastic radiation therapy: Secondary | ICD-10-CM | POA: Diagnosis not present

## 2016-01-02 ENCOUNTER — Ambulatory Visit
Admission: RE | Admit: 2016-01-02 | Discharge: 2016-01-02 | Disposition: A | Discharge status: Home / Self Care | Payer: Medicaid Other | Source: Ambulatory Visit | Attending: Radiation Oncology | Admitting: Radiation Oncology

## 2016-01-02 DIAGNOSIS — Z510 Encounter for antineoplastic radiation therapy: Secondary | ICD-10-CM | POA: Diagnosis not present

## 2016-01-03 ENCOUNTER — Ambulatory Visit
Admission: RE | Admit: 2016-01-03 | Discharge: 2016-01-03 | Disposition: A | Discharge status: Home / Self Care | Payer: Medicaid Other | Source: Ambulatory Visit | Attending: Radiation Oncology | Admitting: Radiation Oncology

## 2016-01-03 DIAGNOSIS — Z510 Encounter for antineoplastic radiation therapy: Secondary | ICD-10-CM | POA: Diagnosis not present

## 2016-01-06 ENCOUNTER — Ambulatory Visit
Admission: RE | Admit: 2016-01-06 | Discharge: 2016-01-06 | Disposition: A | Discharge status: Home / Self Care | Payer: Medicaid Other | Source: Ambulatory Visit | Attending: Radiation Oncology | Admitting: Radiation Oncology

## 2016-01-06 ENCOUNTER — Encounter: Payer: Self-pay | Admitting: Radiation Oncology

## 2016-01-06 VITALS — BP 111/83 | HR 84 | Temp 98.8°F | Ht 59.5 in | Wt 127.0 lb

## 2016-01-06 DIAGNOSIS — D320 Benign neoplasm of cerebral meninges: Secondary | ICD-10-CM

## 2016-01-06 DIAGNOSIS — Z510 Encounter for antineoplastic radiation therapy: Secondary | ICD-10-CM | POA: Diagnosis not present

## 2016-01-06 NOTE — Progress Notes (Signed)
   Weekly Management Note: Outpatient    ICD-9-CM ICD-10-CM   1. Benign meningioma of brain (HCC) 225.2 D32.0     Current Dose:  16.2 Gy  Projected Dose: 52.2 Gy   Narrative:  The patient presents for routine under treatment assessment.  CBCT/MVCT images/Port film x-rays were reviewed.  The chart was checked.  Worsening HAs this weekend on Tylenol.  Physical Findings:  height is 4' 11.5" (1.511 m) and weight is 127 lb (57.6 kg). Her temperature is 98.8 F (37.1 C). Her blood pressure is 111/83 and her pulse is 84.   Wt Readings from Last 3 Encounters:  01/06/16 127 lb (57.6 kg)  12/30/15 127 lb 9.6 oz (57.9 kg)  12/27/15 127 lb 1.6 oz (57.7 kg)  NAD. No skin irritation of alopecia or eye irritation   Impression:  The patient is tolerating radiotherapy.  Plan:  Continue radiotherapy as planned. Alternate 1000mg  Tylenol with 800mg  Ibuprofen (take one or the other every 6hrs  ; don't take ibuprofen more than BID) until pain clinic and neuro appt.  Has followed with neuro for a while, new to pain clinic.  Declines PT once more.  .______________________________   Eppie Gibson, M.D.   This document serves as a record of services personally performed by Eppie Gibson, MD. It was created on her behalf by Bethann Humble, a trained medical scribe. The creation of this record is based on the scribe's personal observations and the provider's statements to them. This document has been checked and approved by the attending provider.

## 2016-01-06 NOTE — Progress Notes (Signed)
Melanie Dillon has received 9 fractions for her right meningioma of theright temporal region.  She reports a level 5/10 headache in this area and reports that headache "much worst" over the weekend.  Denies any blurred vision today and reports that "sometimes" she has ataxia and "bumps" into objects, but no falls at this time.      BP 111/83   Pulse 84   Temp 98.8 F (37.1 C)   Ht 4' 11.5" (1.511 m)   Wt 127 lb (57.6 kg)   BMI 25.22 kg/m    Wt Readings from Last 3 Encounters:  01/06/16 127 lb (57.6 kg)  12/30/15 127 lb 9.6 oz (57.9 kg)  12/27/15 127 lb 1.6 oz (57.7 kg)

## 2016-01-07 ENCOUNTER — Ambulatory Visit
Admission: RE | Admit: 2016-01-07 | Discharge: 2016-01-07 | Disposition: A | Discharge status: Home / Self Care | Payer: Medicaid Other | Source: Ambulatory Visit | Attending: Radiation Oncology | Admitting: Radiation Oncology

## 2016-01-07 DIAGNOSIS — Z510 Encounter for antineoplastic radiation therapy: Secondary | ICD-10-CM | POA: Diagnosis not present

## 2016-01-08 ENCOUNTER — Telehealth: Payer: Self-pay | Admitting: *Deleted

## 2016-01-08 ENCOUNTER — Ambulatory Visit
Admission: RE | Admit: 2016-01-08 | Discharge: 2016-01-08 | Disposition: A | Discharge status: Home / Self Care | Payer: Medicaid Other | Source: Ambulatory Visit | Attending: Radiation Oncology | Admitting: Radiation Oncology

## 2016-01-08 DIAGNOSIS — Z510 Encounter for antineoplastic radiation therapy: Secondary | ICD-10-CM | POA: Diagnosis not present

## 2016-01-08 NOTE — Telephone Encounter (Signed)
CALLED PATIENT TO INFORM OF PROGRESS ON PAIN CLINIC REFERRAL, SPOKE WITH PATIENT AND SHE KNOWS THAT I FAXING INFO. TO [facility] OFFICE

## 2016-01-09 ENCOUNTER — Ambulatory Visit
Admission: RE | Admit: 2016-01-09 | Discharge: 2016-01-09 | Disposition: A | Discharge status: Home / Self Care | Payer: Medicaid Other | Source: Ambulatory Visit | Attending: Radiation Oncology | Admitting: Radiation Oncology

## 2016-01-09 DIAGNOSIS — Z510 Encounter for antineoplastic radiation therapy: Secondary | ICD-10-CM | POA: Diagnosis not present

## 2016-01-10 ENCOUNTER — Ambulatory Visit
Admission: RE | Admit: 2016-01-10 | Discharge: 2016-01-10 | Disposition: A | Discharge status: Home / Self Care | Payer: Medicaid Other | Source: Ambulatory Visit | Attending: Radiation Oncology | Admitting: Radiation Oncology

## 2016-01-10 DIAGNOSIS — Z510 Encounter for antineoplastic radiation therapy: Secondary | ICD-10-CM | POA: Diagnosis not present

## 2016-01-13 ENCOUNTER — Encounter: Payer: Self-pay | Admitting: Radiation Oncology

## 2016-01-13 ENCOUNTER — Ambulatory Visit
Admission: RE | Admit: 2016-01-13 | Discharge: 2016-01-13 | Disposition: A | Discharge status: Home / Self Care | Payer: Medicaid Other | Source: Ambulatory Visit | Attending: Radiation Oncology | Admitting: Radiation Oncology

## 2016-01-13 VITALS — BP 127/87 | HR 70 | Temp 98.3°F | Resp 12 | Wt 129.6 lb

## 2016-01-13 DIAGNOSIS — D320 Benign neoplasm of cerebral meninges: Secondary | ICD-10-CM

## 2016-01-13 DIAGNOSIS — Z510 Encounter for antineoplastic radiation therapy: Secondary | ICD-10-CM | POA: Diagnosis not present
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
## 2016-01-13 NOTE — Progress Notes (Signed)
PAIN: She rates her pain as a 5 on a scale of 0-10. constant, sharp, burning, dull, stabbing, tingling and aching over head right sided, eye pain.  Headaches everyday NEURO: Pt alert & oriented x 3 with fluent speech. Pt reports positive for eye pain right eye, PERRLA, recurrent ringing in left ear. Pt presenting appropriate quality, quantity and organization of sentences, slurred. SKIN: Skin warm, dry and intact.  Reports hair over treatment area is thinning  OTHER: Pt complains of fatigue and loss of sleep. NAUSEA- last two weeks  BP 127/87   Pulse 70   Temp 98.3 F (36.8 C) (Oral)   Resp 12   Wt 129 lb 9.6 oz (58.8 kg)   SpO2 100%   BMI 25.74 kg/m  Wt Readings from Last 3 Encounters:  01/13/16 129 lb 9.6 oz (58.8 kg)  01/06/16 127 lb (57.6 kg)  12/30/15 127 lb 9.6 oz (57.9 kg)

## 2016-01-14 ENCOUNTER — Ambulatory Visit
Admission: RE | Admit: 2016-01-14 | Discharge: 2016-01-14 | Disposition: A | Discharge status: Home / Self Care | Payer: Medicaid Other | Source: Ambulatory Visit | Attending: Radiation Oncology | Admitting: Radiation Oncology

## 2016-01-14 DIAGNOSIS — Z510 Encounter for antineoplastic radiation therapy: Secondary | ICD-10-CM | POA: Diagnosis not present

## 2016-01-15 ENCOUNTER — Ambulatory Visit
Admission: RE | Admit: 2016-01-15 | Discharge: 2016-01-15 | Disposition: A | Discharge status: Home / Self Care | Payer: Medicaid Other | Source: Ambulatory Visit | Attending: Radiation Oncology | Admitting: Radiation Oncology

## 2016-01-15 DIAGNOSIS — Z510 Encounter for antineoplastic radiation therapy: Secondary | ICD-10-CM | POA: Diagnosis not present

## 2016-01-16 ENCOUNTER — Ambulatory Visit
Admission: RE | Admit: 2016-01-16 | Discharge: 2016-01-16 | Disposition: A | Discharge status: Home / Self Care | Payer: Medicaid Other | Source: Ambulatory Visit | Attending: Radiation Oncology | Admitting: Radiation Oncology

## 2016-01-16 DIAGNOSIS — Z510 Encounter for antineoplastic radiation therapy: Secondary | ICD-10-CM | POA: Diagnosis not present

## 2016-01-17 ENCOUNTER — Ambulatory Visit
Admission: RE | Admit: 2016-01-17 | Discharge: 2016-01-17 | Disposition: A | Discharge status: Home / Self Care | Payer: Medicaid Other | Source: Ambulatory Visit | Attending: Radiation Oncology | Admitting: Radiation Oncology

## 2016-01-17 DIAGNOSIS — Z510 Encounter for antineoplastic radiation therapy: Secondary | ICD-10-CM | POA: Diagnosis not present

## 2016-01-20 ENCOUNTER — Encounter: Payer: Self-pay | Admitting: Radiation Oncology

## 2016-01-20 ENCOUNTER — Ambulatory Visit
Admission: RE | Admit: 2016-01-20 | Discharge: 2016-01-20 | Disposition: A | Discharge status: Home / Self Care | Payer: Medicaid Other | Source: Ambulatory Visit | Attending: Radiation Oncology | Admitting: Radiation Oncology

## 2016-01-20 ENCOUNTER — Telehealth: Payer: Self-pay | Admitting: *Deleted

## 2016-01-20 VITALS — BP 119/98 | HR 77 | Temp 98.4°F | Resp 12 | Wt 129.2 lb

## 2016-01-20 DIAGNOSIS — Z510 Encounter for antineoplastic radiation therapy: Secondary | ICD-10-CM | POA: Diagnosis not present

## 2016-01-20 DIAGNOSIS — D320 Benign neoplasm of cerebral meninges: Secondary | ICD-10-CM

## 2016-01-20 NOTE — Progress Notes (Addendum)
   Weekly Management Note: Outpatient    ICD-9-CM ICD-10-CM   1. Benign meningioma of brain (HCC) 225.2 D32.0     Current Dose:  34.2 Gy  Projected Dose: 52.2 Gy   Narrative:  The patient presents for routine under treatment assessment.  CBCT/MVCT images/Port film x-rays were reviewed.  The chart was checked. Tearing of eye, retro orbital pain on right.  Lost her eye doctor in the past due to Medicaid refusal.  Physical Findings:  weight is 129 lb 3.2 oz (58.6 kg). Her oral temperature is 98.4 F (36.9 C). Her blood pressure is 119/98 (abnormal) and her pulse is 77. Her respiration is 12 and oxygen saturation is 100%.   Wt Readings from Last 3 Encounters:  01/20/16 129 lb 3.2 oz (58.6 kg)  01/13/16 129 lb 9.6 oz (58.8 kg)  01/06/16 127 lb (57.6 kg)  NAD. Early hair loss in right scalp, right eye is tearing and slightly erythematous.  PERRLA.  Right exophthalmos is stable  Impression:  The patient is tolerating radiotherapy.  Plan:  Continue radiotherapy as planned.   Our clinic is trying to find a pain specialist to see her.  This has been challenging.  She sees neurology.  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 She will apply aquaphor to eyelid and artificial tears to her eye.  Note: states weakness to left arm when doing her hair - this was transient and likely related to lack of circulation in that position.  No workup needed at this time. ______________________________   Eppie Gibson, M.D.   This document serves as a record of services personally performed by Eppie Gibson, MD. It was created on her behalf by Bethann Humble, a trained medical scribe. The creation of this record is based on the scribe's personal observations and the provider's statements to them. This document has been checked and approved by the attending provider.

## 2016-01-20 NOTE — Telephone Encounter (Signed)
CALLED PATIENT TO INFORM OF APPT. WITH DR. Valetta Close OF [facility] ON 03-09-16- ARRIVAL TIME - 2:30 PM, ADDRESS- [address] [place] [address]. [address]. [phone], LVM FOR A RETURN CALL

## 2016-01-20 NOTE — Progress Notes (Signed)
PAIN: Rates pain a 4/10, constant describing, aches, throbbing pain over head and right eye area. NEURO: Pt alert & oriented x 3 with fluent speech, reports on Friday she felt weakness to left arm was fixing her hair. Pt reports positive for excessive tearing both eyes, right is worse, eye pain right eye, PERRLA, recurrent ringing bilateral sound in ears. Pt presenting appropriate quality, quantity and organization of sentences. Pt reports right frontal and temporal area headaches. SKIN: Noted dryness and slight pink tone to right temporal and ear area. Continues to applied Sonafine as directed  OTHER: Pt complains of fatigue and weakness. Gave Eucerin ointment to apply on Right eyelid for night time drainage and irration.   BP (!) 119/98   Pulse 77   Temp 98.4 F (36.9 C) (Oral)   Resp 12   Wt 129 lb 3.2 oz (58.6 kg)   SpO2 100%   BMI 25.66 kg/m  Wt Readings from Last 3 Encounters:  01/20/16 129 lb 3.2 oz (58.6 kg)  01/13/16 129 lb 9.6 oz (58.8 kg)  01/06/16 127 lb (57.6 kg)

## 2016-01-20 NOTE — Addendum Note (Signed)
Encounter addended by: Eppie Gibson, MD on: 01/20/2016 11:42 AM<BR>    Actions taken: Sign clinical note

## 2016-01-21 ENCOUNTER — Ambulatory Visit
Admission: RE | Admit: 2016-01-21 | Discharge: 2016-01-21 | Disposition: A | Discharge status: Home / Self Care | Payer: Medicaid Other | Source: Ambulatory Visit | Attending: Radiation Oncology | Admitting: Radiation Oncology

## 2016-01-21 DIAGNOSIS — Z510 Encounter for antineoplastic radiation therapy: Secondary | ICD-10-CM | POA: Diagnosis not present

## 2016-01-22 ENCOUNTER — Ambulatory Visit
Admission: RE | Admit: 2016-01-22 | Discharge: 2016-01-22 | Disposition: A | Discharge status: Home / Self Care | Payer: Medicaid Other | Source: Ambulatory Visit | Attending: Radiation Oncology | Admitting: Radiation Oncology

## 2016-01-22 ENCOUNTER — Telehealth: Payer: Self-pay | Admitting: *Deleted

## 2016-01-22 DIAGNOSIS — Z510 Encounter for antineoplastic radiation therapy: Secondary | ICD-10-CM | POA: Diagnosis not present

## 2016-01-22 NOTE — Telephone Encounter (Signed)
CALLED PATIENT TO INFORM THAT DR. HARKINS OFFICE CALLED AND THEY DIDN'T FEEL THAT THEY COULD HELP WITH HER HEADACHES, SINCE SHE IS SEEING DR. Jannifer Franklin, SPOKE WITH PATIENT AND SHE IS AWARE OF THIS.

## 2016-01-23 ENCOUNTER — Ambulatory Visit
Admission: RE | Admit: 2016-01-23 | Discharge: 2016-01-23 | Disposition: A | Discharge status: Home / Self Care | Payer: Medicaid Other | Source: Ambulatory Visit | Attending: Radiation Oncology | Admitting: Radiation Oncology

## 2016-01-23 DIAGNOSIS — Z510 Encounter for antineoplastic radiation therapy: Secondary | ICD-10-CM | POA: Diagnosis not present

## 2016-01-24 ENCOUNTER — Ambulatory Visit
Admission: RE | Admit: 2016-01-24 | Discharge: 2016-01-24 | Disposition: A | Discharge status: Home / Self Care | Payer: Medicaid Other | Source: Ambulatory Visit | Attending: Radiation Oncology | Admitting: Radiation Oncology

## 2016-01-24 DIAGNOSIS — Z510 Encounter for antineoplastic radiation therapy: Secondary | ICD-10-CM | POA: Diagnosis not present

## 2016-01-27 ENCOUNTER — Ambulatory Visit
Admission: RE | Admit: 2016-01-27 | Discharge: 2016-01-27 | Disposition: A | Discharge status: Home / Self Care | Payer: Medicaid Other | Source: Ambulatory Visit | Attending: Radiation Oncology | Admitting: Radiation Oncology

## 2016-01-27 ENCOUNTER — Encounter: Payer: Self-pay | Admitting: Radiation Oncology

## 2016-01-27 VITALS — BP 109/94 | HR 89 | Temp 98.8°F | Resp 18 | Ht 59.5 in | Wt 130.0 lb

## 2016-01-27 DIAGNOSIS — Z510 Encounter for antineoplastic radiation therapy: Secondary | ICD-10-CM | POA: Diagnosis not present

## 2016-01-27 DIAGNOSIS — H5789 Other specified disorders of eye and adnexa: Secondary | ICD-10-CM

## 2016-01-27 DIAGNOSIS — D320 Benign neoplasm of cerebral meninges: Secondary | ICD-10-CM

## 2016-01-27 MED ORDER — ARTIFICIAL TEARS 0.1-0.3 % OP SOLN: 3.0000 [drp] | OPHTHALMIC | 10 refills | Status: DC | PRN

## 2016-01-27 NOTE — Progress Notes (Signed)
   Weekly Management Note: Outpatient    ICD-9-CM ICD-10-CM   1. Irritation of eye 379.99 H57.8 ARTIFICIAL TEARS 0.1-0.3 % SOLN  2. Benign meningioma of brain (HCC) 225.2 D32.0 ARTIFICIAL TEARS 0.1-0.3 % SOLN    Current Dose:  43.2 Gy  Projected Dose: 52.2 Gy   Narrative:  The patient presents for routine under treatment assessment.  CBCT/MVCT images/Port film x-rays were reviewed.  The chart was checked. Tearing of eye,could not afford artifical tears.  Got an appt in 1 mo or so w/ opthalmology. Sees neurology soon for HAs. Pain clinics will not accept her but neurology Dr Jannifer Franklin will work on this.  Physical Findings:  height is 4' 11.5" (1.511 m) and weight is 130 lb (59 kg). Her oral temperature is 98.8 F (37.1 C). Her blood pressure is 109/94 (abnormal) and her pulse is 89. Her respiration is 18 and oxygen saturation is 100%.   Wt Readings from Last 3 Encounters:  01/27/16 130 lb (59 kg)  01/20/16 129 lb 3.2 oz (58.6 kg)  01/13/16 129 lb 9.6 oz (58.8 kg)  NAD. Early hair loss in right scalp, right eye is tearing and slightly erythematous (stable)  Impression:  The patient is tolerating radiotherapy.  Plan:  Continue radiotherapy as planned. Rx given for artificial tears; social work contacted to see if there is funding to help w/ future Rxs or OTC meds  ______________________________   Eppie Gibson, M.D.

## 2016-01-27 NOTE — Progress Notes (Signed)
PAIN: Rates pain a 6/10, constant describing, aches, throbbing pain over head and right eye area. NEURO: Pt alert & oriented x 3 with fluent speech, reports weakness to left arm is ongoing.  Reports positive for excessive tearing both eyes, right is worse, eye pain right eye, PERRLA, recurrent ringing bilateral sound in ears. Pt presenting appropriate quality, quantity and organization of sentences. Pt reports right frontal and temporal area headaches. SKIN: Noted dryness and slight pink tone to right temporal and ear area. Continues to applied Sonafine as directed  OTHER: Pt complains of fatigue and weakness.  Still using Eucerin ointment to  Right eyelid for night time drainage and irration. Wt Readings from Last 3 Encounters:  01/27/16 130 lb (59 kg)  01/20/16 129 lb 3.2 oz (58.6 kg)  01/13/16 129 lb 9.6 oz (58.8 kg)  BP (!) 109/94   Pulse 89   Temp 98.8 F (37.1 C) (Oral)   Resp 18   Ht 4' 11.5" (1.511 m)   Wt 130 lb (59 kg)   SpO2 100%   BMI 25.82 kg/m

## 2016-01-28 ENCOUNTER — Ambulatory Visit
Admission: RE | Admit: 2016-01-28 | Discharge: 2016-01-28 | Disposition: A | Discharge status: Home / Self Care | Payer: Medicaid Other | Source: Ambulatory Visit | Attending: Radiation Oncology | Admitting: Radiation Oncology

## 2016-01-28 DIAGNOSIS — Z510 Encounter for antineoplastic radiation therapy: Secondary | ICD-10-CM | POA: Diagnosis not present

## 2016-01-29 ENCOUNTER — Ambulatory Visit
Admission: RE | Admit: 2016-01-29 | Discharge: 2016-01-29 | Disposition: A | Discharge status: Home / Self Care | Payer: Medicaid Other | Source: Ambulatory Visit | Attending: Radiation Oncology | Admitting: Radiation Oncology

## 2016-01-29 DIAGNOSIS — Z510 Encounter for antineoplastic radiation therapy: Secondary | ICD-10-CM | POA: Diagnosis not present

## 2016-01-30 ENCOUNTER — Ambulatory Visit (INDEPENDENT_AMBULATORY_CARE_PROVIDER_SITE_OTHER): Payer: Medicaid Other | Admitting: Neurology

## 2016-01-30 ENCOUNTER — Encounter: Payer: Self-pay | Admitting: Neurology

## 2016-01-30 ENCOUNTER — Ambulatory Visit
Admission: RE | Admit: 2016-01-30 | Discharge: 2016-01-30 | Disposition: A | Discharge status: Home / Self Care | Payer: Medicaid Other | Source: Ambulatory Visit | Attending: Radiation Oncology | Admitting: Radiation Oncology

## 2016-01-30 VITALS — BP 119/82 | HR 79 | Ht 60.0 in | Wt 130.0 lb

## 2016-01-30 DIAGNOSIS — D320 Benign neoplasm of cerebral meninges: Secondary | ICD-10-CM | POA: Diagnosis not present

## 2016-01-30 DIAGNOSIS — M791 Myalgia: Secondary | ICD-10-CM | POA: Diagnosis not present

## 2016-01-30 DIAGNOSIS — R51 Headache: Secondary | ICD-10-CM

## 2016-01-30 DIAGNOSIS — R519 Headache, unspecified: Secondary | ICD-10-CM

## 2016-01-30 DIAGNOSIS — Z510 Encounter for antineoplastic radiation therapy: Secondary | ICD-10-CM | POA: Diagnosis not present

## 2016-01-30 DIAGNOSIS — M7918 Myalgia, other site: Secondary | ICD-10-CM

## 2016-01-30 MED ORDER — TOPIRAMATE 25 MG PO TABS: ORAL_TABLET | ORAL | 3 refills | Status: DC

## 2016-01-30 NOTE — Progress Notes (Signed)
Reason for visit: Headache  Melanie Dillon is an 35 y.o. female  History of present illness:  Melanie Dillon is a 35 year old right-handed white female with a history of a right sided meningioma that is affecting the orbits, anterior fossa, and involving the cavernous sinus. The meningioma is growing in a sheetlike fashion, and cannot be resected. The patient is undergoing radiation therapy currently, she will finish up in the next week or so. The patient has a myofascial type pain syndrome that is helped by the Robaxin to some degree. She cannot open her mouth all the way. She also has discomfort behind the eye, she has proptosis on the right associated with the tumor. The patient has been on Cymbalta and Lyrica, but these medications offered little benefit and she has stopped the drugs. She may take aspirin or ibuprofen for pain. The patient has a headache at all times, the severity of the headache may undulate some. The patient describes a sharp shooting pain and some throbbing at times. The patient does have photophobia with the headache. She denies any significant nausea or vomiting. She returns for an evaluation.  Past Medical History:  Diagnosis Date  . Arthritis   . Asthma   . Chronic back pain greater than 3 months duration 2014  . Headache disorder 12/25/2014   Myofascial pain syndrome on right  . Myofascial pain syndrome 12/25/2014   right    Past Surgical History:  Procedure Laterality Date  . CHOLECYSTECTOMY    . CRANIOTOMY Right 05/11/2014   Procedure: Right Frontal Temporal Craniotomy for resection of meningioma;  Surgeon: Consuella Lose, MD;  Location: [facility];  Service: Neurosurgery;  Laterality: Right;  Right Frontal Temporal Craniotomy for resection of meningioma    Family History  Problem Relation Age of Onset  . Hypertension Mother   . Heart disease Mother   . Diabetes Mother   . Hypertension Father   . Heart disease Father   . Diabetes Father   .  Hypertension Maternal Grandmother   . Heart disease Maternal Grandmother   . Diabetes Maternal Grandmother   . Bladder Cancer Maternal Grandmother   . Diabetes Maternal Grandfather   . Heart disease Maternal Grandfather   . Hypertension Maternal Grandfather   . Hypertension Paternal Grandmother   . Heart disease Paternal Grandmother   . Diabetes Paternal Grandmother   . Bladder Cancer Paternal Grandmother   . Diabetes Paternal Grandfather   . Heart disease Paternal Grandfather   . Hypertension Paternal Grandfather   . Migraines Neg Hx     Social history:  reports that she has been smoking Cigarettes.  She has a 20.00 pack-year smoking history. She has never used smokeless tobacco. She reports that she drinks alcohol. She reports that she does not use drugs.    Allergies  Allergen Reactions  . Latex Itching  . Mobic [Meloxicam] Swelling    Swelling of legs  . Gabapentin Rash    Medications:  Prior to Admission medications   Medication Sig Start Date End Date Taking? Authorizing Provider  albuterol (PROVENTIL HFA;VENTOLIN HFA) 108 (90 BASE) MCG/ACT inhaler Inhale 2 puffs into the lungs every 6 (six) hours as needed for wheezing or shortness of breath.   Yes Historical Provider, MD  ibuprofen (ADVIL,MOTRIN) 800 MG tablet Take 800 mg by mouth 3 (three) times daily.    Yes Historical Provider, MD  medroxyPROGESTERone (DEPO-PROVERA) 150 MG/ML injection Inject 1 mL into the muscle every 3 (three) months. 04/09/14  Yes Historical Provider, MD  methocarbamol (ROBAXIN) 500 MG tablet Take 1 tablet (500 mg total) by mouth 4 (four) times daily. 10/16/15  Yes Ward Givens, NP  rOPINIRole (REQUIP) 0.5 MG tablet Take 0.5 mg by mouth at bedtime. 02/07/15  Yes Historical Provider, MD  ARTIFICIAL TEARS 0.1-0.3 % SOLN Apply 3 drops to eye every hour as needed. Patient not taking: Reported on 01/30/2016 01/27/16   Eppie Gibson, MD  DULoxetine (CYMBALTA) 30 MG capsule Take 1 tablet PO daily for 2  weeks then increase to 1 tablet BID therafter Patient not taking: Reported on 01/30/2016 10/16/15   Ward Givens, NP  pregabalin (LYRICA) 50 MG capsule Take 1 capsule (50 mg total) by mouth 3 (three) times daily. Patient not taking: Reported on 01/30/2016 07/18/15   Ward Givens, NP    ROS:  Out of a complete 14 system review of symptoms, the patient complains only of the following symptoms, and all other reviewed systems are negative.  Weight gain, fatigue Ringing in the ears Eye pain Wheezing Joint pain, achy muscles Memory loss, headache, numbness, weakness Decreased energy Insomnia, restless legs  Blood pressure 119/82, pulse 79, height 5' (1.524 m), weight 130 lb (59 kg).  Physical Exam  General: The patient is alert and cooperative at the time of the examination.  Skin: No significant peripheral edema is noted.   Neurologic Exam  Mental status: The patient is alert and oriented x 3 at the time of the examination. The patient has apparent normal recent and remote memory, with an apparently normal attention span and concentration ability.   Cranial nerves: Facial symmetry is present. Speech is normal, no aphasia or dysarthria is noted. Extraocular movements are full. Visual fields are full. Proptosis is noted on the right.  Motor: The patient has good strength in all 4 extremities.  Sensory examination: Soft touch sensation is symmetric on the face, arms, and legs.  Coordination: The patient has good finger-nose-finger and heel-to-shin bilaterally.  Gait and station: The patient has a normal gait. Tandem gait is minimally unsteady. Romberg is negative. No drift is seen.  Reflexes: Deep tendon reflexes are symmetric.   Assessment/Plan:  1. History of meningioma  2. Chronic daily headache  3. Myofascial pain syndrome  The patient will continue the Robaxin. She has not gained benefit with Lyrica or Cymbalta. She could not tolerate gabapentin. She will be placed  on Topamax, she is not sure whether or not she has been on this medication previously. She will follow-up in 4 months. Hopefully, the radiation therapy may help some of the headache pain.  Jill Alexanders MD 01/30/2016 10:11 AM  [facility] [address] [place], [place] [address]  Phone [phone] Fax [phone]

## 2016-01-30 NOTE — Patient Instructions (Signed)
   Topamax (topiramate) is a seizure medication that has an FDA approval for seizures and for migraine headache. Potential side effects of this medication include weight loss, cognitive slowing, tingling in the fingers and toes, and carbonated drinks will taste bad. If any significant side effects are noted on this drug, please contact our office.  

## 2016-01-31 ENCOUNTER — Ambulatory Visit
Admission: RE | Admit: 2016-01-31 | Discharge: 2016-01-31 | Disposition: A | Discharge status: Home / Self Care | Payer: Medicaid Other | Source: Ambulatory Visit | Attending: Radiation Oncology | Admitting: Radiation Oncology

## 2016-01-31 DIAGNOSIS — Z510 Encounter for antineoplastic radiation therapy: Secondary | ICD-10-CM | POA: Diagnosis not present

## 2016-02-03 ENCOUNTER — Ambulatory Visit: Admission: RE | Admit: 2016-02-03 | Payer: Medicaid Other | Source: Ambulatory Visit

## 2016-02-03 ENCOUNTER — Ambulatory Visit: Admission: RE | Admit: 2016-02-03 | Payer: Medicaid Other | Source: Ambulatory Visit | Admitting: Radiation Oncology

## 2016-02-03 ENCOUNTER — Encounter: Payer: Medicaid Other | Admitting: Radiation Oncology

## 2016-02-04 ENCOUNTER — Ambulatory Visit
Admission: RE | Admit: 2016-02-04 | Discharge: 2016-02-04 | Disposition: A | Discharge status: Home / Self Care | Payer: Medicaid Other | Source: Ambulatory Visit | Attending: Radiation Oncology | Admitting: Radiation Oncology

## 2016-02-04 ENCOUNTER — Encounter: Payer: Self-pay | Admitting: Radiation Oncology

## 2016-02-04 VITALS — BP 132/83 | HR 89 | Temp 98.6°F | Ht 60.0 in | Wt 129.0 lb

## 2016-02-04 DIAGNOSIS — Z510 Encounter for antineoplastic radiation therapy: Secondary | ICD-10-CM | POA: Diagnosis not present

## 2016-02-04 DIAGNOSIS — D320 Benign neoplasm of cerebral meninges: Secondary | ICD-10-CM

## 2016-02-04 NOTE — Progress Notes (Signed)
Melanie Dillon is here for her last fraction of radiation to her Right Meningioma. She reports a chronic headache a 5/10. She has recently seen Dr. Jannifer Franklin, her neurologist to evaluate this chronic headache. He has prescribed medicine, but [name] is going to speak to her pharmacist about these medicines and if she has been on them before. She is using aquaphor to her radiation site. There is no redness present. She reports being off balance, but this has occurred since her surgery. Dr. Jannifer Franklin is aware of this. She also reports a decreased appetite. She denies any other concerns at this time.   BP 132/83   Pulse 89   Temp 98.6 F (37 C)   Ht 5' (1.524 m)   Wt 129 lb (58.5 kg)   SpO2 100% Comment: room air  BMI 25.19 kg/m    Wt Readings from Last 3 Encounters:  02/04/16 129 lb (58.5 kg)  01/30/16 130 lb (59 kg)  01/27/16 130 lb (59 kg)

## 2016-02-04 NOTE — Progress Notes (Signed)
  Radiation Oncology         [phone] ________________________________  Name: Melanie Dillon MRN: [id number]  Date: 02/04/2016  DOB: 18-Apr-1980  Weekly Radiation Therapy Management    ICD-9-CM ICD-10-CM   1. Benign meningioma of brain (HCC) 225.2 D32.0      Current Dose: 52.2 Gy     Planned Dose:  52.2 Gy  Narrative . . . . . . . . The patient presents for routine under treatment assessment.  Melanie Dillon is here for her last fraction of radiation to her Right Meningioma. She reports a chronic headache as a 5/10. She has recently seen Dr. Jannifer Franklin, her neurologist, to evaluate this chronic headache. He has prescribed medicine, but Melanie Dillon is going to speak to her pharmacist about these medicines and if she has been on them before. She is using aquaphor to her radiation site. There is no redness present. She reports being off balance, but this has occurred since her surgery. Dr. Jannifer Franklin is aware of this. She also reports a decreased appetite. She denies pruritus in the treatment area. She denies any other concerns at this time.                                  Set-up films were reviewed.                                 The chart was checked. Physical Findings. . .  height is 5' (1.524 m) and weight is 129 lb (58.5 kg). Her temperature is 98.6 F (37 C). Her blood pressure is 132/83 and her pulse is 89. Her oxygen saturation is 100%. . Weight essentially stable.  No significant changes. EOMI. Right eye slightly erythematous. Some hair loss along the right side. Impression . . . . . . . The patient has tolerating radiation. Plan . . . . . . . . . . . . The patient has completed treatment and will return to the clinic in 1 month for a follow up. ________________________________   Blair Promise, PhD, MD  This document serves as a record of services personally performed by Gery Pray, MD. It was created on his behalf by Darcus Austin, a trained medical scribe. The creation of this  record is based on the scribe's personal observations and the provider's statements to them. This document has been checked and approved by the attending provider.

## 2016-02-05 ENCOUNTER — Encounter: Payer: Self-pay | Admitting: Radiation Oncology

## 2016-02-19 NOTE — Progress Notes (Signed)
  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 The patient had a frontal headache that has been present during surgery, but worsened during radiation. The patient was on Ibuprofen 800 mg TID and I prescribed Decadron 2 mg TID for the pain. Decadron and Ibuprofen were then stopped due to not working (and risk of Ibuprofen toxicity issues) and and her pain worsened on Tylenol. I had the patient alternate Tylenol 1000 mg Tylenol with 800 mg Ibuprofen (take one or the other every 6 hrs and don't take Ibuprofen more than BID). I then made a referral to the pain clinic, but the pain clinic did not accept her. Neurology, Dr. Jannifer Franklin, stated he  would help with her pain. The patient had hair loss in the right scalp and tearing of the right eye. She lost her opthalmologist due to Medicaid refusal and the patient could not afford artificial tears. I made a referral to opthalmology. Social work was contacted to see if there was funding to help with future Rxs and OTC meds.  Plan: The patient has completed radiation treatment. The patient will return to radiation oncology clinic for routine followup in one month. I advised them to call or return sooner if they have any questions or concerns related to their recovery or treatment.  -----------------------------------  Eppie Gibson, MD  This document serves as a record of services personally performed by Eppie Gibson, MD. It was created on her behalf by  Darcus Austin, a trained medical scribe. The creation of this record is based on the scribe's personal observations and the provider's statements to them. This document has been checked and approved by the attending provider.

## 2016-03-04 ENCOUNTER — Encounter: Payer: Self-pay | Admitting: Radiation Oncology

## 2016-03-06 ENCOUNTER — Encounter: Payer: Self-pay | Admitting: Radiation Oncology

## 2016-03-06 ENCOUNTER — Ambulatory Visit
Admission: RE | Admit: 2016-03-06 | Discharge: 2016-03-06 | Disposition: A | Discharge status: Home / Self Care | Payer: Medicaid Other | Source: Ambulatory Visit | Attending: Radiation Oncology | Admitting: Radiation Oncology

## 2016-03-06 VITALS — BP 107/77 | HR 75 | Temp 98.2°F | Ht 60.0 in | Wt 130.8 lb

## 2016-03-06 DIAGNOSIS — Z888 Allergy status to other drugs, medicaments and biological substances status: Secondary | ICD-10-CM | POA: Diagnosis not present

## 2016-03-06 DIAGNOSIS — R51 Headache: Secondary | ICD-10-CM | POA: Insufficient documentation

## 2016-03-06 DIAGNOSIS — D320 Benign neoplasm of cerebral meninges: Secondary | ICD-10-CM | POA: Diagnosis present

## 2016-03-06 DIAGNOSIS — Z923 Personal history of irradiation: Secondary | ICD-10-CM | POA: Diagnosis not present

## 2016-03-06 DIAGNOSIS — Z91040 Latex allergy status: Secondary | ICD-10-CM | POA: Insufficient documentation

## 2016-03-06 HISTORY — DX: Personal history of irradiation: Z92.3

## 2016-03-06 NOTE — Progress Notes (Signed)
Ms. Pool is here for follow up of radiation completed 02/04/16 to her Right Meningioma. She reports continued headaches which she rates a 4/10. She states this pain is worse at times and better at other times. She takes ibuprofen 800 mg three times daily with little relief. She does have ringing in her ears which alterates between bilateral ears. Her Right eye continues to drain, and is also better some days than others. She is not using artificial tears, because they did not help her.   BP 107/77   Pulse 75   Temp 98.2 F (36.8 C)   Ht 5' (1.524 m)   Wt 130 lb 12.8 oz (59.3 kg)   SpO2 100% Comment: room air  BMI 25.55 kg/m    Wt Readings from Last 3 Encounters:  03/06/16 130 lb 12.8 oz (59.3 kg)  02/04/16 129 lb (58.5 kg)  01/30/16 130 lb (59 kg)

## 2016-03-06 NOTE — Progress Notes (Signed)
Radiation Oncology         [phone] ________________________________  Name: Melanie Dillon MRN: [id number]  Date: 03/06/2016  DOB: 1980-07-25  Follow-Up Visit Note  Outpatient  CC: Rosita Fire, MD  Rosita Fire, MD  Diagnosis and Prior Radiotherapy:    ICD-9-CM ICD-10-CM   1. Benign meningioma of brain ([place]) 225.2 D32.0     CHIEF COMPLAINT: Here for follow-up and surveillance of benign meningioma of the brain   Narrative:  The patient returns today for routine follow-up of radiation completed to the brain on 02/04/16.   The patient reports continued headaches, which she rates 4/10 in severity. She states that this pain is worse at times, and better at others. She takes ibuprofen 800 mg 3 x daily with little relief. The patient also reports ringing in her ears which alternates between bilateral ears. Her right eye continues to drain, but is better some days than others. She reports that using artificial tears did not help her, and so she discontinued use. Additionally, she reports feeling that her hair is still thinning. The patient reports having trouble remembering to take Topamax.  The patient has an appointment with [facility] next week.                       ALLERGIES:  is allergic to latex; mobic [meloxicam]; and gabapentin.  Meds: Current Outpatient Prescriptions  Medication Sig Dispense Refill  . albuterol (PROVENTIL HFA;VENTOLIN HFA) 108 (90 BASE) MCG/ACT inhaler Inhale 2 puffs into the lungs every 6 (six) hours as needed for wheezing or shortness of breath.    Marland Kitchen ibuprofen (ADVIL,MOTRIN) 800 MG tablet Take 800 mg by mouth 3 (three) times daily.     . medroxyPROGESTERone (DEPO-PROVERA) 150 MG/ML injection Inject 1 mL into the muscle every 3 (three) months.  3  . methocarbamol (ROBAXIN) 500 MG tablet Take 1 tablet (500 mg total) by mouth 4 (four) times daily. 120 tablet 3  . rOPINIRole (REQUIP) 0.5 MG tablet Take 0.5 mg by mouth at bedtime.  3  .  vitamin E 400 UNIT capsule Take 400 Units by mouth 2 (two) times daily.    . ARTIFICIAL TEARS 0.1-0.3 % SOLN Apply 3 drops to eye every hour as needed. (Patient not taking: Reported on 03/06/2016) 30 mL 10  . topiramate (TOPAMAX) 25 MG tablet Take one tablet at night for one week, then take 2 tablets at night for one week, then take 3 tablets at night. (Patient not taking: Reported on 03/06/2016) 90 tablet 3   No current facility-administered medications for this encounter.     Physical Findings: The patient is in no acute distress. Patient is alert and oriented.  height is 5' (1.524 m) and weight is 130 lb 12.8 oz (59.3 kg). Her temperature is 98.2 F (36.8 C). Her blood pressure is 107/77 and her pulse is 75. Her oxygen saturation is 100%.   General: Alert and oriented, in no acute distress HEENT: Patch of alopecia behind and above the ear on the right scalp. Oral cavity is moist, no thrush. She still has proptosis of the right eye. Extraocular movements are intact. Pupils are equally round and reactive to light, with mild watering of the right eye, though the globe does not appear to be irritated. Neck: Neck is supple, no palpable cervical or supraclavicular lymphadenopathy.. Lymphatics: see Neck Exam Skin: No concerning lesions. Skin in the treatment area appears to be well healed. Neurologic: Speech is fluent. EOMI. PERRLA Psychiatric:  Judgment and insight are intact. Affect is appropriate.  Lab Findings: Lab Results  Component Value Date   WBC 5.6 05/11/2014   HGB 12.1 05/11/2014   HCT 36.4 05/11/2014   MCV 95.3 05/11/2014   PLT 199 05/11/2014    Radiographic Findings: No results found.  Impression/Plan:  I discussed strategies to help the patient remember to take her Topamax, such as setting alarms and forming daily habits that help remind her. Additionally, I encouraged the patient to stop taking Ibuprofen as it does not seem to be helping her headaches. We discussed the health  risks of chronic NSAID use.  I encouraged the patient to have regular follow ups with [facility] at their discretion to monitor her ocular health.  The patient will follow up in 5 month with Dr. Kathyrn Sheriff, with an MRI of the brain preceding that appointment. She knows she may contact me during the interim with any questions or concerns that may arise.   _____________________________________   Eppie Gibson, MD  This document serves as a record of services personally performed by Eppie Gibson, MD. It was created on her behalf by Maryla Morrow, a trained medical scribe. The creation of this record is based on the scribe's personal observations and the provider's statements to them. This document has been checked and approved by the attending provider.

## 2016-03-06 NOTE — Addendum Note (Signed)
Encounter addended by: Ernst Spell, RN on: 03/06/2016  3:55 PM<BR>    Actions taken: Charge Capture section accepted

## 2016-05-07 ENCOUNTER — Other Ambulatory Visit: Payer: Self-pay | Admitting: Adult Health

## 2016-05-07 NOTE — Telephone Encounter (Signed)
Called and spoke to patient. She stated she is currently still taking ibuprofen 800mg  3x/day. She was buying over the counter, but she states she is having to take 4 tablets each time and would prefer a prescription. She received this from pain management prior. She thought MM,NP prescribed this to her before. Advised I will send to MM,NP to see if she will approve refilling this. If she does, we will send refill. If not, I will call back. She verbalized understanding.

## 2016-05-11 NOTE — Telephone Encounter (Signed)
Called and LVM for pt advising MM,NP denied refill. She can request from PCP or continue to buy OTC. She can also discuss with CW,MD at her f/u next month. Gave [facility] phone number if she has further questions or concerns.

## 2016-06-05 ENCOUNTER — Encounter: Payer: Self-pay | Admitting: Neurology

## 2016-06-05 ENCOUNTER — Ambulatory Visit (INDEPENDENT_AMBULATORY_CARE_PROVIDER_SITE_OTHER): Payer: Medicaid Other | Admitting: Neurology

## 2016-06-05 VITALS — BP 129/88 | HR 86 | Ht 60.0 in | Wt 127.5 lb

## 2016-06-05 DIAGNOSIS — D320 Benign neoplasm of cerebral meninges: Secondary | ICD-10-CM

## 2016-06-05 DIAGNOSIS — R519 Headache, unspecified: Secondary | ICD-10-CM

## 2016-06-05 DIAGNOSIS — M791 Myalgia: Secondary | ICD-10-CM | POA: Diagnosis not present

## 2016-06-05 DIAGNOSIS — R51 Headache: Secondary | ICD-10-CM

## 2016-06-05 DIAGNOSIS — M7918 Myalgia, other site: Secondary | ICD-10-CM

## 2016-06-05 MED ORDER — NORTRIPTYLINE HCL 10 MG PO CAPS: ORAL_CAPSULE | ORAL | 3 refills | Status: DC

## 2016-06-05 NOTE — Progress Notes (Signed)
Reason for visit: Headache  Melanie Dillon is an 36 y.o. female  History of present illness:  Melanie Dillon is a 36 year old right-handed white female with a history of an inoperable meningioma, she has completed radiation therapy in November 2017, she continues to have daily headaches associated with occasional sharp shooting lancinating-type pains. The headaches may get quite severe at least once a week, she cannot function at that time. She is taking ibuprofen for the headache. She was given a trial on Topamax, but this was not effective for the headache and resulted in too much drowsiness. The patient does not always sleep well at night, however. She continues to have her myofascial pain problems, she will take Robaxin for this. She has some right temporal pain associated with this. The patient returns to this office for an evaluation. She will be having a repeat MRI of the brain in April 2018.  Past Medical History:  Diagnosis Date  . Arthritis   . Asthma   . Chronic back pain greater than 3 months duration 2014  . Headache disorder 12/25/2014   Myofascial pain syndrome on right  . History of radiation therapy 12/25/15- 02/04/16   Right Meningioma, 52.2 Gy in 29 fractions.   . Myofascial pain syndrome 12/25/2014   right    Past Surgical History:  Procedure Laterality Date  . CHOLECYSTECTOMY    . CRANIOTOMY Right 05/11/2014   Procedure: Right Frontal Temporal Craniotomy for resection of meningioma;  Surgeon: Consuella Lose, MD;  Location: [facility];  Service: Neurosurgery;  Laterality: Right;  Right Frontal Temporal Craniotomy for resection of meningioma    Family History  Problem Relation Age of Onset  . Hypertension Mother   . Heart disease Mother   . Diabetes Mother   . Hypertension Father   . Heart disease Father   . Diabetes Father   . Hypertension Maternal Grandmother   . Heart disease Maternal Grandmother   . Diabetes Maternal Grandmother   . Bladder Cancer  Maternal Grandmother   . Diabetes Maternal Grandfather   . Heart disease Maternal Grandfather   . Hypertension Maternal Grandfather   . Hypertension Paternal Grandmother   . Heart disease Paternal Grandmother   . Diabetes Paternal Grandmother   . Bladder Cancer Paternal Grandmother   . Diabetes Paternal Grandfather   . Heart disease Paternal Grandfather   . Hypertension Paternal Grandfather   . Migraines Neg Hx     Social history:  reports that she has been smoking Cigarettes.  She has a 20.00 pack-year smoking history. She has never used smokeless tobacco. She reports that she drinks alcohol. She reports that she does not use drugs.    Allergies  Allergen Reactions  . Latex Itching  . Mobic [Meloxicam] Swelling    Swelling of legs  . Gabapentin Rash    Medications:  Prior to Admission medications   Medication Sig Start Date End Date Taking? Authorizing Provider  albuterol (PROVENTIL HFA;VENTOLIN HFA) 108 (90 BASE) MCG/ACT inhaler Inhale 2 puffs into the lungs every 6 (six) hours as needed for wheezing or shortness of breath.   Yes Historical Provider, MD  ibuprofen (ADVIL,MOTRIN) 800 MG tablet Take 800 mg by mouth 3 (three) times daily.    Yes Historical Provider, MD  medroxyPROGESTERone (DEPO-PROVERA) 150 MG/ML injection Inject 1 mL into the muscle every 3 (three) months. 04/09/14  Yes Historical Provider, MD  methocarbamol (ROBAXIN) 500 MG tablet Take 1 tablet (500 mg total) by mouth 4 (four) times  daily. 10/16/15  Yes Ward Givens, NP  nortriptyline (PAMELOR) 10 MG capsule Take one capsule at night for one week, then take 2 capsules at night for one week, then take 3 capsules at night 06/05/16   Kathrynn Ducking, MD  rOPINIRole (REQUIP) 0.5 MG tablet Take 0.5 mg by mouth at bedtime. 02/07/15   Historical Provider, MD    ROS:  Out of a complete 14 system review of symptoms, the patient complains only of the following symptoms, and all other reviewed systems are  negative.  Ringing in the ears Eye pain Cold intolerance, heat intolerance Restless legs, frequent waking, daytime sleepiness Back pain, achy muscles, neck pain Memory loss, dizziness, headache, numbness Agitation  Blood pressure 129/88, pulse 86, height 5' (1.524 m), weight 127 lb 8 oz (57.8 kg).  Physical Exam  General: The patient is alert and cooperative at the time of the examination.  Skin: No significant peripheral edema is noted.   Neurologic Exam  Mental status: The patient is alert and oriented x 3 at the time of the examination. The patient has apparent normal recent and remote memory, with an apparently normal attention span and concentration ability.   Cranial nerves: Facial symmetry is not present. Proptosis is noted with the right eye. Speech is normal, no aphasia or dysarthria is noted. Extraocular movements are full. Visual fields are full. Pupils are equal, round, and reactive to light. Discs are flat bilaterally. The patient has incomplete opening of the mouth.  Motor: The patient has good strength in all 4 extremities.  Sensory examination: Soft touch sensation is symmetric on the face, arms, and legs.  Coordination: The patient has good finger-nose-finger and heel-to-shin bilaterally.  Gait and station: The patient has a normal gait. Tandem gait is normal. Romberg is negative. No drift is seen.  Reflexes: Deep tendon reflexes are symmetric.   Assessment/Plan:  1. Chronic daily headache  2. Meningioma, inoperable  3. Myofascial pain syndrome  The patient will be given a trial on nortriptyline starting in low dose, gradually working up on the dose. She will follow-up in about 5 months, she will contact our office for any dose adjustments.  Jill Alexanders MD 06/05/2016 10:25 AM  [facility] [address] [place], [place] [address]  Phone [phone] Fax [phone]

## 2016-06-05 NOTE — Patient Instructions (Signed)
   We will start nortriptyline for the headache and jaw pain.  Pamelor (nortriptyline) is an antidepressant medication that has many uses that may include headache, whiplash injuries, or for peripheral neuropathy pain. Side effects may include drowsiness, dry mouth, blurred vision, or constipation. As with any antidepressant medication, worsening depression may occur. If you had any significant side effects, please call our office. The full effects of this medication may take 7-10 days after starting the drug, or going up on the dose.

## 2016-06-22 ENCOUNTER — Telehealth: Payer: Self-pay | Admitting: *Deleted

## 2016-06-22 NOTE — Telephone Encounter (Signed)
On 06-22-16 fax medical records to [employer] , it was consult note, sim & planning note, end of tx note, follow up note,

## 2016-07-03 ENCOUNTER — Other Ambulatory Visit: Payer: Self-pay | Admitting: Radiation Therapy

## 2016-07-03 DIAGNOSIS — D320 Benign neoplasm of cerebral meninges: Secondary | ICD-10-CM

## 2016-07-24 ENCOUNTER — Other Ambulatory Visit: Payer: Self-pay | Admitting: Adult Health

## 2016-08-06 ENCOUNTER — Ambulatory Visit
Admission: RE | Admit: 2016-08-06 | Discharge: 2016-08-06 | Disposition: A | Discharge status: Home / Self Care | Payer: Medicaid Other | Source: Ambulatory Visit | Attending: Radiation Oncology | Admitting: Radiation Oncology

## 2016-08-06 DIAGNOSIS — D320 Benign neoplasm of cerebral meninges: Secondary | ICD-10-CM

## 2016-08-06 MED ORDER — GADOBENATE DIMEGLUMINE 529 MG/ML IV SOLN
12.0000 mL | Freq: Once | INTRAVENOUS | Status: AC | PRN
  Administered 2016-08-06: 12 mL via INTRAVENOUS

## 2016-10-06 ENCOUNTER — Telehealth: Payer: Self-pay | Admitting: *Deleted

## 2016-10-06 NOTE — Telephone Encounter (Signed)
Received request to do [place] methocarbamol on covermymeds.  I called [employer] [phone]). Spoke with [employer]. She stated rx covered by insurance, but showing at pharmacy that pt only has family planning coverage for prescriptions (birth control, antivirals for STD's).  She stated patient will need to contact her case worker for medicaid to re-enroll for full coverage.   I called patient and relayed above info. She was not aware she did not have full coverage anymore. She got no message from case worker. She will call in the am to speak with case worker. She will call us back to let us know how she is going to proceed.

## 2016-10-08 NOTE — Telephone Encounter (Signed)
Her methocarbamol 500mg , #120 [facility] using a goodrx[employer] coupon for $13.42.  Called patient to inform her of the cost.  She is going to see if her Medicaid can be corrected quickly but was appreciative of this information as a backup plan.

## 2016-11-20 ENCOUNTER — Ambulatory Visit: Payer: Medicaid Other | Admitting: Neurology

## 2017-07-30 ENCOUNTER — Other Ambulatory Visit: Payer: Self-pay | Admitting: Physician Assistant

## 2017-07-30 DIAGNOSIS — D329 Benign neoplasm of meninges, unspecified: Secondary | ICD-10-CM

## 2017-08-06 ENCOUNTER — Other Ambulatory Visit: Payer: Self-pay | Admitting: Neurology

## 2017-08-18 ENCOUNTER — Ambulatory Visit
Admission: RE | Admit: 2017-08-18 | Discharge: 2017-08-18 | Disposition: A | Discharge status: Home / Self Care | Payer: Medicaid Other | Source: Ambulatory Visit | Attending: Physician Assistant | Admitting: Physician Assistant

## 2017-08-18 DIAGNOSIS — D329 Benign neoplasm of meninges, unspecified: Secondary | ICD-10-CM

## 2017-08-18 MED ORDER — GADOBENATE DIMEGLUMINE 529 MG/ML IV SOLN
12.0000 mL | Freq: Once | INTRAVENOUS | Status: AC | PRN
  Administered 2017-08-18: 12 mL via INTRAVENOUS

## 2017-10-23 ENCOUNTER — Other Ambulatory Visit: Payer: Self-pay | Admitting: Neurology

## 2018-01-26 ENCOUNTER — Other Ambulatory Visit: Payer: Self-pay

## 2018-01-26 ENCOUNTER — Ambulatory Visit: Payer: Medicaid Other | Admitting: Neurology

## 2018-01-26 ENCOUNTER — Encounter: Payer: Self-pay | Admitting: Neurology

## 2018-01-26 ENCOUNTER — Encounter

## 2018-01-26 ENCOUNTER — Telehealth: Payer: Self-pay | Admitting: Neurology

## 2018-01-26 VITALS — BP 139/95 | HR 64 | Resp 16 | Ht 60.0 in | Wt 135.0 lb

## 2018-01-26 DIAGNOSIS — D320 Benign neoplasm of cerebral meninges: Secondary | ICD-10-CM

## 2018-01-26 DIAGNOSIS — R519 Headache, unspecified: Secondary | ICD-10-CM

## 2018-01-26 DIAGNOSIS — M7918 Myalgia, other site: Secondary | ICD-10-CM | POA: Diagnosis not present

## 2018-01-26 DIAGNOSIS — R51 Headache: Secondary | ICD-10-CM

## 2018-01-26 MED ORDER — METHOCARBAMOL 500 MG PO TABS: 500.0000 mg | ORAL_TABLET | Freq: Four times a day (QID) | ORAL | 5 refills | Status: AC

## 2018-01-26 MED ORDER — NORTRIPTYLINE HCL 25 MG PO CAPS: ORAL_CAPSULE | ORAL | 3 refills | Status: DC

## 2018-01-26 NOTE — Telephone Encounter (Signed)
Nortriptyline escribed to [facility] as requested/fim

## 2018-01-26 NOTE — Patient Instructions (Signed)
We will restart nortriptyline for the headache. Call for any dose adjustments.  Pamelor (nortriptyline) is an antidepressant medication that has many uses that may include headache, whiplash injuries, or for peripheral neuropathy pain. Side effects may include drowsiness, dry mouth, blurred vision, or constipation. As with any antidepressant medication, worsening depression may occur. If you had any significant side effects, please call our office. The full effects of this medication may take 7-10 days after starting the drug, or going up on the dose.

## 2018-01-26 NOTE — Addendum Note (Signed)
Addended by: France Ravens I on: 01/26/2018 11:22 AM   Modules accepted: Orders

## 2018-01-26 NOTE — Telephone Encounter (Signed)
Pt states her medication has been sent to the wrong pharmacy.  Pt has always had her nortriptyline (PAMELOR) 25 MG capsule to go to   [facility], [place], [place] - [place] [phone] (Phone) [phone] (Fax)   Please correct

## 2018-01-26 NOTE — Progress Notes (Signed)
Reason for visit: Headache, myofascial pain  Melanie Dillon is an 37 y.o. female  History of present illness:  Melanie Dillon is a 37 year old right-handed white female with a history of a myofascial pain syndrome on the right and history of headaches in the right frontotemporal region.  The patient has a history of meningioma status post radiation.  The patient when last seen was placed on low-dose nortriptyline, she took the medication only for a month, she tolerated the drug but never called for any dose adjustments, she stopped the medication.  The patient has been on some Robaxin for the myofascial pain which does help some.  The patient has daily headaches, at times she has to lie down because of the headache.  She has photophobia and phonophobia, she had an event of a classic migraine 2 to 3 days ago with a kaleidoscope of colors in the right visual field that lasted 5 or 10 minutes and then cleared and that she had a severe right frontotemporal headache.  The patient does have some issues with sleeping at times.  She in the past has been on gabapentin and Topamax, these medications did not offer benefit, she is allergic to gabapentin.  The patient returns for an evaluation.  Past Medical History:  Diagnosis Date  . Arthritis   . Asthma   . Chronic back pain greater than 3 months duration 2014  . Headache disorder 12/25/2014   Myofascial pain syndrome on right  . History of radiation therapy 12/25/15- 02/04/16   Right Meningioma, 52.2 Gy in 29 fractions.   . Myofascial pain syndrome 12/25/2014   right    Past Surgical History:  Procedure Laterality Date  . CHOLECYSTECTOMY    . CRANIOTOMY Right 05/11/2014   Procedure: Right Frontal Temporal Craniotomy for resection of meningioma;  Surgeon: Consuella Lose, MD;  Location: [facility];  Service: Neurosurgery;  Laterality: Right;  Right Frontal Temporal Craniotomy for resection of meningioma    Family History  Problem Relation  Age of Onset  . Hypertension Mother   . Heart disease Mother   . Diabetes Mother   . Hypertension Father   . Heart disease Father   . Diabetes Father   . Hypertension Maternal Grandmother   . Heart disease Maternal Grandmother   . Diabetes Maternal Grandmother   . Bladder Cancer Maternal Grandmother   . Diabetes Maternal Grandfather   . Heart disease Maternal Grandfather   . Hypertension Maternal Grandfather   . Hypertension Paternal Grandmother   . Heart disease Paternal Grandmother   . Diabetes Paternal Grandmother   . Bladder Cancer Paternal Grandmother   . Diabetes Paternal Grandfather   . Heart disease Paternal Grandfather   . Hypertension Paternal Grandfather   . Migraines Neg Hx     Social history:  reports that she has been smoking cigarettes. She has a 20.00 pack-year smoking history. She has never used smokeless tobacco. She reports that she drinks alcohol. She reports that she does not use drugs.    Allergies  Allergen Reactions  . Latex Itching  . Mobic [Meloxicam] Swelling    Swelling of legs  . Gabapentin Rash    Medications:  Prior to Admission medications   Medication Sig Start Date End Date Taking? Authorizing Provider  albuterol (PROVENTIL HFA;VENTOLIN HFA) 108 (90 BASE) MCG/ACT inhaler Inhale 2 puffs into the lungs every 6 (six) hours as needed for wheezing or shortness of breath.   Yes [provider]  ibuprofen (ADVIL,MOTRIN)  800 MG tablet Take 800 mg by mouth 3 (three) times daily.    Yes [provider]  medroxyPROGESTERone (DEPO-PROVERA) 150 MG/ML injection Inject 1 mL into the muscle every 3 (three) months. 04/09/14  Yes [provider]  methocarbamol (ROBAXIN) 500 MG tablet TAKE ONE TABLET BY MOUTH FOUR TIMES DAILY 10/25/17  Yes Kathrynn Ducking, MD  rOPINIRole (REQUIP) 0.5 MG tablet Take 0.5 mg by mouth at bedtime. 02/07/15  Yes [provider]  nortriptyline (PAMELOR) 10 MG capsule Take one capsule at night for  one week, then take 2 capsules at night for one week, then take 3 capsules at night Patient not taking: Reported on 01/26/2018 06/05/16   Kathrynn Ducking, MD    ROS:  Out of a complete 14 system review of symptoms, the patient complains only of the following symptoms, and all other reviewed systems are negative.  Ringing in the ears Light sensitivity, eye pain Wheezing Cold intolerance, heat intolerance Restless legs Joint pain, back pain, aching muscles, muscle cramps, neck stiffness Memory loss, headache  Blood pressure (!) 139/95, pulse 64, resp. rate 16, height 5' (1.524 m), weight 135 lb (61.2 kg).  Physical Exam  General: The patient is alert and cooperative at the time of the examination.  Skin: No significant peripheral edema is noted.   Neurologic Exam  Mental status: The patient is alert and oriented x 3 at the time of the examination. The patient has apparent normal recent and remote memory, with an apparently normal attention span and concentration ability.   Cranial nerves: Facial symmetry is present. Speech is normal, no aphasia or dysarthria is noted. Extraocular movements are full. Visual fields are full.  Motor: The patient has good strength in all 4 extremities.  Sensory examination: Soft touch sensation is symmetric on the face, arms, and legs.  Coordination: The patient has good finger-nose-finger and heel-to-shin bilaterally.  Gait and station: The patient has a normal gait. Tandem gait is normal. Romberg is negative. No drift is seen.  Reflexes: Deep tendon reflexes are symmetric.   Assessment/Plan:  1.  Intractable headache, with migrainous features  2.  History of meningioma  3.  Myofascial pain syndrome, right  The patient will be placed back on nortriptyline and be given a fair trial on the medication.  She will start at 25 mg at night for 2 weeks and go to 50 mg at night, she will call for any dose adjustments.  The patient will follow-up  in 6 months.  In the future, the patient could be a good candidate for Botox.   Jill Alexanders MD 01/26/2018 9:10 AM  [facility] [address] [place], [place] [address]  Phone [phone] Fax [phone]

## 2018-02-28 ENCOUNTER — Other Ambulatory Visit: Payer: Self-pay | Admitting: Neurology

## 2018-03-02 ENCOUNTER — Telehealth: Payer: Self-pay | Admitting: Neurology

## 2018-03-02 NOTE — Telephone Encounter (Signed)
Pt states this methocarbamol (ROBAXIN) 500 MG tablet still needs to be sent to  [facility] , [facility]

## 2018-03-03 NOTE — Telephone Encounter (Signed)
LMOM earlier today that pt. can [facility]/fim

## 2018-03-07 NOTE — Telephone Encounter (Signed)
[facility]. plus 5 r[facility]f [facility] I spoke with [facility]. [facility]. I spoke with pt. and made her aware[facility]fim

## 2018-03-07 NOTE — Telephone Encounter (Signed)
Pt has called to inform RN that she has been without the methocarbamol (ROBAXIN) 500 MG tablet since the 3rd.  Pt has spoken with  [facility] and they have not received the Rx yet.  Pt asking this be sent as soon as RN is able.

## 2018-08-17 ENCOUNTER — Telehealth: Payer: Self-pay | Admitting: Neurology

## 2018-08-17 NOTE — Telephone Encounter (Signed)
Due to current COVID 19 pandemic, our office is severely reducing in office visits until further notice, in order to minimize the risk to our patients and healthcare providers.   Called patient and confirmed a virtual visit for her 5/27 appointment. Patient verbalized understanding of the doxy.me process and I have sent her an e-mail with link and directions as well as my name and office number/hours for reference. Patient understands that she will receive a call from RN to update chart.  Pt understands that although there may be some limitations with this type of visit, we will take all precautions to reduce any security or privacy concerns.  Pt understands that this will be treated like an in office visit and we will file with pt's insurance, and there may be a patient responsible charge related to this service.

## 2018-08-18 NOTE — Telephone Encounter (Signed)
I attempted to reach the pt to complete pre charting for 08/24/18 virtual visit.  Pt was not available so I left a vm. Pt was provided with [facility]'s hrs and phone #. Pt was also advised of Holiday for office on 08/24/18 and that we would be closed.

## 2018-08-24 ENCOUNTER — Other Ambulatory Visit: Payer: Self-pay

## 2018-08-24 ENCOUNTER — Ambulatory Visit (INDEPENDENT_AMBULATORY_CARE_PROVIDER_SITE_OTHER): Payer: Medicaid Other | Admitting: Neurology

## 2018-08-24 ENCOUNTER — Encounter: Payer: Self-pay | Admitting: Neurology

## 2018-08-24 DIAGNOSIS — M7918 Myalgia, other site: Secondary | ICD-10-CM | POA: Diagnosis not present

## 2018-08-24 DIAGNOSIS — G43019 Migraine without aura, intractable, without status migrainosus: Secondary | ICD-10-CM

## 2018-08-24 DIAGNOSIS — D320 Benign neoplasm of cerebral meninges: Secondary | ICD-10-CM | POA: Diagnosis not present

## 2018-08-24 HISTORY — DX: Migraine without aura, intractable, without status migrainosus: G43.019

## 2018-08-24 MED ORDER — ERENUMAB-AOOE 140 MG/ML ~~LOC~~ SOAJ: 140.0000 mg | SUBCUTANEOUS | 4 refills | Status: DC

## 2018-08-24 NOTE — Progress Notes (Signed)
     Virtual Visit via Video Note  I connected with Melanie Dillon on 08/24/18 at 10:00 AM EDT by a video enabled telemedicine application and verified that I am speaking with the correct person using two identifiers.  Location: Patient: The patient is at home. Provider: The physician is in office.   I discussed the limitations of evaluation and management by telemedicine and the availability of in person appointments. The patient expressed understanding and agreed to proceed.  History of Present Illness: Melanie Dillon is a 38 year old right-handed white female with a history of intractable headaches.  The patient mainly has right-sided headaches that are virtually daily in nature, she may have more severe headaches 2 or 3 times a week.  She takes Robaxin for the headache but she did not get benefit from the nortriptyline and stopped the medication 1 or 2 months ago.  She never called our office to let us know that she is not doing well.  She is not working, she has not worked since 2014 because of problems with her back, she was found to have a meningioma about a year after she went out of work.  The patient denies any visual field changes with the headache, she does have some blurring of vision at times.  She denies any nausea or vomiting.  The pain is most severe behind the right eye.  In the past, she has been treated with gabapentin and Topamax, these medications did not offer benefit.   Observations/Objective: The patient is alert and cooperative at the time of the evaluation.  The patient has full extraocular movements.  Speech is well enunciated, not aphasic or dysarthric.  She is able to protrude the tongue in the midline with good lateral movement of the tongue.  Facial symmetry is present.  She has good finger-nose-finger and heel shin bilaterally.  Gait is normal.  Tandem gait is normal.  Romberg is negative.  No drift is seen.  Range of movement the cervical spine is full.   Assessment and Plan: 1.  Intractable migraine headache  2.  Myofascial pain syndrome  The patient has not gained benefit from several medications for her headache, we will try Aimovig.  If this is not effective, we will set her up for Botox injections.  She will follow-up through this office in 3 months.  Follow Up Instructions: 3 follow-up, may see nurse practitioner.   I discussed the assessment and treatment plan with the patient. The patient was provided an opportunity to ask questions and all were answered. The patient agreed with the plan and demonstrated an understanding of the instructions.   The patient was advised to call back or seek an in-person evaluation if the symptoms worsen or if the condition fails to improve as anticipated.  I provided 15 minutes of non-face-to-face time during this encounter.   Kathrynn Ducking, MD

## 2018-08-25 ENCOUNTER — Telehealth: Payer: Self-pay | Admitting: Neurology

## 2018-08-25 NOTE — Telephone Encounter (Signed)
LVM to schedule a 3 month follow-up with Judson Roch NP per Dr. Jannifer Franklin.

## 2018-09-06 ENCOUNTER — Other Ambulatory Visit: Payer: Self-pay | Admitting: Physician Assistant

## 2018-09-06 DIAGNOSIS — D329 Benign neoplasm of meninges, unspecified: Secondary | ICD-10-CM

## 2018-09-22 ENCOUNTER — Other Ambulatory Visit: Payer: Self-pay

## 2018-09-22 ENCOUNTER — Ambulatory Visit
Admission: RE | Admit: 2018-09-22 | Discharge: 2018-09-22 | Disposition: A | Discharge status: Home / Self Care | Payer: Medicaid Other | Source: Ambulatory Visit | Attending: Physician Assistant | Admitting: Physician Assistant

## 2018-09-22 DIAGNOSIS — D329 Benign neoplasm of meninges, unspecified: Secondary | ICD-10-CM

## 2018-09-22 MED ORDER — GADOBENATE DIMEGLUMINE 529 MG/ML IV SOLN
12.0000 mL | Freq: Once | INTRAVENOUS | Status: AC | PRN
  Administered 2018-09-22: 12 mL via INTRAVENOUS

## 2018-09-29 DIAGNOSIS — Z0271 Encounter for disability determination: Secondary | ICD-10-CM

## 2019-06-27 ENCOUNTER — Other Ambulatory Visit (HOSPITAL_COMMUNITY): Payer: Self-pay | Admitting: Neurology

## 2019-06-27 ENCOUNTER — Ambulatory Visit (HOSPITAL_COMMUNITY)
Admission: RE | Admit: 2019-06-27 | Discharge: 2019-06-27 | Disposition: A | Discharge status: Home / Self Care | Payer: Medicaid Other | Source: Ambulatory Visit | Attending: Neurology | Admitting: Neurology

## 2019-06-27 ENCOUNTER — Other Ambulatory Visit: Payer: Self-pay

## 2019-06-27 DIAGNOSIS — M79641 Pain in right hand: Secondary | ICD-10-CM | POA: Diagnosis not present

## 2019-08-15 DIAGNOSIS — Z0289 Encounter for other administrative examinations: Secondary | ICD-10-CM

## 2019-09-12 ENCOUNTER — Other Ambulatory Visit: Payer: Self-pay | Admitting: Neurosurgery

## 2019-09-12 DIAGNOSIS — D329 Benign neoplasm of meninges, unspecified: Secondary | ICD-10-CM

## 2019-10-18 ENCOUNTER — Other Ambulatory Visit: Payer: Medicaid Other

## 2019-11-16 ENCOUNTER — Ambulatory Visit
Admission: RE | Admit: 2019-11-16 | Discharge: 2019-11-16 | Disposition: A | Discharge status: Home / Self Care | Payer: Medicaid Other | Source: Ambulatory Visit | Attending: Neurosurgery | Admitting: Neurosurgery

## 2019-11-16 DIAGNOSIS — D329 Benign neoplasm of meninges, unspecified: Secondary | ICD-10-CM

## 2019-11-16 MED ORDER — GADOBENATE DIMEGLUMINE 529 MG/ML IV SOLN
11.0000 mL | Freq: Once | INTRAVENOUS | Status: AC | PRN
  Administered 2019-11-16: 11 mL via INTRAVENOUS

## 2019-11-23 ENCOUNTER — Ambulatory Visit: Payer: Medicaid Other | Attending: Internal Medicine

## 2019-11-23 DIAGNOSIS — Z23 Encounter for immunization: Secondary | ICD-10-CM

## 2019-11-23 NOTE — Progress Notes (Signed)
   Covid-19 Vaccination Clinic  Name:  Melanie Dillon    MRN: [id number] DOB: Jan 25, 1981  11/23/2019  [name] was observed post Covid-19 immunization for 15 minutes without incident. She was provided with Vaccine Information Sheet and instruction to access the V-Safe system.   [name] was instructed to call 911 with any severe reactions post vaccine: Marland Kitchen Difficulty breathing  . Swelling of face and throat  . A fast heartbeat  . A bad rash all over body  . Dizziness and weakness   Immunizations Administered    Name Date Dose VIS Date Route   Pfizer COVID-19 Vaccine 11/23/2019 10:53 AM 0.3 mL 05/24/2018 Intramuscular   Manufacturer: [place]   Lot: [id number]   [place]: [address]-2

## 2019-12-14 ENCOUNTER — Ambulatory Visit: Payer: Medicaid Other

## 2019-12-21 ENCOUNTER — Ambulatory Visit: Payer: Medicaid Other

## 2019-12-28 ENCOUNTER — Ambulatory Visit: Payer: Medicaid Other | Attending: Internal Medicine

## 2019-12-28 DIAGNOSIS — Z23 Encounter for immunization: Secondary | ICD-10-CM

## 2019-12-28 NOTE — Progress Notes (Signed)
   Covid-19 Vaccination Clinic  Name:  TOMORROW DEHAAS    MRN: [id number] DOB: [birth year]  12/28/2019  Ms. Deroche was observed post Covid-19 immunization for 15 minutes without incident. She was provided with Vaccine Information Sheet and instruction to access the V-Safe system.   Ms. Ditton was instructed to call 911 with any severe reactions post vaccine: Marland Kitchen Difficulty breathing  . Swelling of face and throat  . A fast heartbeat  . A bad rash all over body  . Dizziness and weakness   Immunizations Administered    Name Date Dose VIS Date Route   Pfizer COVID-19 Vaccine 12/28/2019 11:59 AM 0.3 mL 05/24/2018 Intramuscular   Manufacturer: [place]   Lot: [id number]   [place]: [id number]

## 2020-11-01 ENCOUNTER — Other Ambulatory Visit: Payer: Self-pay | Admitting: Neurosurgery

## 2020-11-01 DIAGNOSIS — D329 Benign neoplasm of meninges, unspecified: Secondary | ICD-10-CM

## 2020-11-19 ENCOUNTER — Ambulatory Visit
Admission: RE | Admit: 2020-11-19 | Discharge: 2020-11-19 | Disposition: A | Discharge status: Home / Self Care | Payer: Medicaid Other | Source: Ambulatory Visit | Attending: Neurosurgery | Admitting: Neurosurgery

## 2020-11-19 DIAGNOSIS — D329 Benign neoplasm of meninges, unspecified: Secondary | ICD-10-CM

## 2020-11-19 MED ORDER — GADOBENATE DIMEGLUMINE 529 MG/ML IV SOLN
12.0000 mL | Freq: Once | INTRAVENOUS | Status: AC | PRN
Start: 2020-11-19 — End: 2020-11-19
  Administered 2020-11-19: 12 mL via INTRAVENOUS

## 2021-05-15 ENCOUNTER — Other Ambulatory Visit: Payer: Self-pay

## 2021-05-15 ENCOUNTER — Ambulatory Visit (HOSPITAL_COMMUNITY)
Admission: RE | Admit: 2021-05-15 | Discharge: 2021-05-15 | Disposition: A | Discharge status: Home / Self Care | Payer: Medicaid Other | Source: Ambulatory Visit | Attending: Neurology | Admitting: Neurology

## 2021-05-15 ENCOUNTER — Other Ambulatory Visit (HOSPITAL_COMMUNITY): Payer: Self-pay | Admitting: Neurology

## 2021-05-15 DIAGNOSIS — M25512 Pain in left shoulder: Secondary | ICD-10-CM | POA: Insufficient documentation

## 2021-05-15 DIAGNOSIS — M542 Cervicalgia: Secondary | ICD-10-CM

## 2021-12-08 ENCOUNTER — Other Ambulatory Visit: Payer: Self-pay | Admitting: Neurosurgery

## 2021-12-08 DIAGNOSIS — D329 Benign neoplasm of meninges, unspecified: Secondary | ICD-10-CM

## 2022-01-05 ENCOUNTER — Ambulatory Visit
Admission: RE | Admit: 2022-01-05 | Discharge: 2022-01-05 | Disposition: A | Discharge status: Home / Self Care | Payer: Medicaid Other | Source: Ambulatory Visit | Attending: Neurosurgery | Admitting: Neurosurgery

## 2022-01-05 DIAGNOSIS — D329 Benign neoplasm of meninges, unspecified: Secondary | ICD-10-CM

## 2022-01-05 MED ORDER — GADOBENATE DIMEGLUMINE 529 MG/ML IV SOLN
12.0000 mL | Freq: Once | INTRAVENOUS | Status: AC | PRN
  Administered 2022-01-05: 12 mL via INTRAVENOUS

## 2022-01-20 ENCOUNTER — Encounter: Payer: Self-pay | Admitting: Neurology

## 2022-03-16 ENCOUNTER — Ambulatory Visit: Payer: Medicaid Other | Admitting: Neurology

## 2022-03-16 ENCOUNTER — Encounter: Payer: Self-pay | Admitting: Neurology

## 2022-03-16 VITALS — BP 89/54 | HR 84 | Ht 59.0 in | Wt 134.8 lb

## 2022-03-16 DIAGNOSIS — M79672 Pain in left foot: Secondary | ICD-10-CM

## 2022-03-16 DIAGNOSIS — M79671 Pain in right foot: Secondary | ICD-10-CM

## 2022-03-16 DIAGNOSIS — G8929 Other chronic pain: Secondary | ICD-10-CM | POA: Diagnosis not present

## 2022-03-16 NOTE — Progress Notes (Signed)
[facility] Clinic Note - Initial Visit   Date: 03/16/2022   Melanie Dillon MRN: [id number] DOB: [birth year]   Dear Dr. Doran Durand:  Thank you for your kind referral of Melanie Dillon for consultation of bilateral feet pain. Although her history is well known to you, please allow [name] to reiterate it for the purpose of our medical record. The patient was accompanied to the clinic by husband who also provides collateral information.     Melanie Dillon is a 41 y.o. right-handed female with right meningioma s/p resection, migraine, and chronic pain presenting for evaluation of bilateral feet pain.   IMPRESSION/PLAN: Bilateral feet pain.  The nature of her pain seems more musculoskeletal and lacks neuropathic pain features.  Additionally, her neurological exam is normal making neuropathy less likely.  To be complete, she will return for NCS/EMG of the legs.  ------------------------------------------------------------- History of present illness: She complains of achy and soreness of the soles of her feet which started in 2014.  She preferred to walk on the outer side of the feet.  Pain is relived with rest.  She occasionally has numbness/tingling of the pain, but this is not the primary complaint.  She quit working at [employer] and quit working because of the pain. She does not get relief with NSAIDs or tylenol.  She was seeing sports medicine who performed x-ray of the feet which was normal.  She is referred for concern of neuropathy.  She has no history of diabetes or history of chemotherapy.  She drinks several shots about once per week.    Out-side paper records, electronic medical record, and images have been reviewed where available and summarized as:  MRI brain wwo contrast 01/06/2022: 1. No change since the previous study. Previous pterional craniotomy on the right for meningioma resection. Stable dural thickening and enhancement along the greater wing  of the sphenoid on the right. Some enhancement occurs within the right optic canal and along the right V3 nerve, unchanged. Mild exophthalmos on the right is unchanged. 2. Scattered foci of T2 and FLAIR signal within the cerebral hemispheric white matter, unchanged.     Past Medical History:  Diagnosis Date   Arthritis    Asthma    Chronic back pain greater than 3 months duration 2014   Common migraine with intractable migraine 08/24/2018   Headache disorder 12/25/2014   Myofascial pain syndrome on right   History of radiation therapy 12/25/15- 02/04/16   Right Meningioma, 52.2 Gy in 29 fractions.    Myofascial pain syndrome 12/25/2014   right    Past Surgical History:  Procedure Laterality Date   CHOLECYSTECTOMY     CRANIOTOMY Right 05/11/2014   Procedure: Right Frontal Temporal Craniotomy for resection of meningioma;  Surgeon: Consuella Lose, MD;  Location: [facility];  Service: Neurosurgery;  Laterality: Right;  Right Frontal Temporal Craniotomy for resection of meningioma     Medications:  Outpatient Encounter Medications as of 03/16/2022  Medication Sig Note   albuterol (PROVENTIL HFA;VENTOLIN HFA) 108 (90 BASE) MCG/ACT inhaler Inhale 2 puffs into the lungs every 6 (six) hours as needed for wheezing or shortness of breath.    HYDROcodone-acetaminophen (NORCO/VICODIN) 5-325 MG tablet Take 1 tablet by mouth daily as needed.    ibuprofen (ADVIL,MOTRIN) 800 MG tablet Take 800 mg by mouth 3 (three) times daily.    medroxyPROGESTERone (DEPO-PROVERA) 150 MG/ML injection Inject 1 mL into the muscle every 3 (three) months.    methocarbamol (ROBAXIN) 500 MG  tablet Take 1 tablet (500 mg total) by mouth 4 (four) times daily.    NURTEC 75 MG TBDP Take 75 mg by mouth as needed.    rOPINIRole (REQUIP) 0.5 MG tablet Take 0.5 mg by mouth at bedtime. 06/05/2016: Out of medication per pt   [DISCONTINUED] Erenumab-aooe (AIMOVIG) 140 MG/ML SOAJ Inject 140 mg into the skin every 30 (thirty)  days.    No facility-administered encounter medications on file as of 03/16/2022.    Allergies:  Allergies  Allergen Reactions   Latex Itching   Mobic [Meloxicam] Swelling    Swelling of legs   Gabapentin Rash    Family History: Family History  Problem Relation Age of Onset   Hypertension Mother    Heart disease Mother    Diabetes Mother    Hypertension Father    Heart disease Father    Diabetes Father    Hypertension Maternal Grandmother    Heart disease Maternal Grandmother    Diabetes Maternal Grandmother    Bladder Cancer Maternal Grandmother    Diabetes Maternal Grandfather    Heart disease Maternal Grandfather    Hypertension Maternal Grandfather    Hypertension Paternal Grandmother    Heart disease Paternal Grandmother    Diabetes Paternal Grandmother    Bladder Cancer Paternal Grandmother    Diabetes Paternal Grandfather    Heart disease Paternal Grandfather    Hypertension Paternal Grandfather    Migraines Neg Hx     Social History: Social History   Tobacco Use   Smoking status: Every Day    Packs/day: 1.00    Years: 20.00    Total pack years: 20.00    Types: Cigarettes   Smokeless tobacco: Never  Vaping Use   Vaping Use: Never used  Substance Use Topics   Alcohol use: Yes    Alcohol/week: 0.0 standard drinks of alcohol    Comment: rarely   Drug use: No   Social History   Social History Narrative   Lives at home w/ her husband and children   Patient drinks 2-3 cups of caffeine daily.   Patient is right handed.    1 story 3 steps     Vital Signs:  BP (!) 89/54   Pulse 84   Ht '4\' 11"'$  (1.499 m)   Wt 134 lb 12.8 oz (61.1 kg)   SpO2 97%   BMI 27.23 kg/m   Neurological Exam: MENTAL STATUS including orientation to time, place, person, recent and remote memory, attention span and concentration, language, and fund of knowledge is normal.  Speech is not dysarthric.  CRANIAL NERVES: II:  No visual field defects.    III-IV-VI: Pupils equal  round and reactive to light.  Normal conjugate, extra-ocular eye movements in all directions of gaze.  No nystagmus.  No ptosis.   V:  Normal facial sensation.    VII:  Normal facial symmetry and movements.   VIII:  Normal hearing and vestibular function.   IX-X:  Normal palatal movement.   XI:  Normal shoulder shrug and head rotation.   XII:  Normal tongue strength and range of motion, no deviation or fasciculation. Poor dentition.  MOTOR:  Motor strength is 5/5 throughout. Some pain-limiting weakness with deltoid testing. No atrophy, fasciculations or abnormal movements.  No pronator drift.   MSRs:  Right        Left brachioradialis 2+  2+  biceps 2+  2+  triceps 2+  2+  patellar 2+  2+  ankle jerk 2+  2+  Hoffman no  no  plantar response down  down   SENSORY:  Normal and symmetric perception of light touch, pinprick, vibration, and temperature.  Romberg's sign absent.   COORDINATION/GAIT: Normal finger-to- nose-finger.  Intact rapid alternating movements bilaterally. Gait narrow based and stable. Tandem and stressed gait intact.     Thank you for allowing me to participate in patient's care.  If I can answer any additional questions, I would be pleased to do so.    Sincerely,    Grayland Daisey K. Posey Pronto, DO

## 2022-03-16 NOTE — Patient Instructions (Signed)

## 2022-05-07 ENCOUNTER — Ambulatory Visit (INDEPENDENT_AMBULATORY_CARE_PROVIDER_SITE_OTHER): Payer: Medicaid Other | Admitting: Neurology

## 2022-05-07 DIAGNOSIS — M79671 Pain in right foot: Secondary | ICD-10-CM

## 2022-05-07 DIAGNOSIS — M79672 Pain in left foot: Secondary | ICD-10-CM | POA: Diagnosis not present

## 2022-05-07 DIAGNOSIS — G8929 Other chronic pain: Secondary | ICD-10-CM

## 2022-05-07 NOTE — Procedures (Signed)
[facility]  [address]  [place], [place] [address] Tel: [phone] Fax: [phone] Test Date:  05/07/2022  Patient: Melanie Dillon DOB: 1980/05/07 Physician: Narda Amber, DO  Sex: Female Height: '4\' 11"'$  Ref Phys: Narda Amber, DO  ID#: [id number]   Technician:    History: This is a 42 year old female referred for evaluation of bilateral feet pain.  NCV & EMG Findings: Electrodiagnostic testing of the right lower extremity and additional studies of the left shows: Bilateral sural and superficial peroneal sensory responses are within normal limits. Bilateral peroneal and tibial motor responses are within normal limits. Bilateral tibial H reflex studies are within normal limits. There is no evidence of active or chronic motor axonal changes affecting any of the tested muscles.  Motor unit configuration and recruitment pattern is within normal limits.   Impression: This is a normal study of the lower extremities.  In particular, there is no evidence of a large fiber sensorimotor polyneuropathy or lumbosacral radiculopathy.    ___________________________ Narda Amber, DO    Nerve Conduction Studies   Stim Site NR Peak (ms) Norm Peak (ms) O-P Amp (V) Norm O-P Amp  Left Sup Peroneal Anti Sensory (Ant Lat Mall)  33 C  12 cm    3.1 <4.5 11.7 >5  Right Sup Peroneal Anti Sensory (Ant Lat Mall)  33 C  12 cm    2.5 <4.5 10.3 >5  Left Sural Anti Sensory (Lat Mall)  33 C  Calf    2.7 <4.5 35.2 >5  Right Sural Anti Sensory (Lat Mall)  33 C  Calf    3.2 <4.5 36.0 >5     Stim Site NR Onset (ms) Norm Onset (ms) O-P Amp (mV) Norm O-P Amp Site1 Site2 Delta-0 (ms) Dist (cm) Vel (m/s) Norm Vel (m/s)  Left Peroneal Motor (Ext Dig Brev)  33 C  Ankle    3.6 <5.5 8.8 >3 B Fib Ankle 6.4 34.0 53 >40  B Fib    10.0  8.5  Poplt B Fib 1.5 8.0 53 >40  Poplt    11.5  8.1         Right Peroneal Motor (Ext Dig Brev)  33 C  Ankle    3.1 <5.5 7.7 >3 B Fib Ankle  6.4 30.0 47 >40  B Fib    9.5  7.1  Poplt B Fib 1.6 8.0 50 >40  Poplt    11.1  6.8         Left Tibial Motor (Abd Hall Brev)  33 C  Ankle    3.6 <6.0 16.1 >8 Knee Ankle 7.3 36 48 >40  Knee    10.9  9.8         Right Tibial Motor (Abd Hall Brev)  33 C  Ankle    3.4 <6.0 22.8 >8 Knee Ankle 7.6 37.0 49 >40  Knee    11.0  16.1          Electromyography   Side Muscle Ins.Act Fibs Fasc Recrt Amp Dur Poly Activation Comment  Right AntTibialis Nml Nml Nml Nml Nml Nml Nml Nml N/A  Right Gastroc Nml Nml Nml Nml Nml Nml Nml Nml N/A  Right Flex Dig Long Nml Nml Nml Nml Nml Nml Nml Nml N/A  Right RectFemoris Nml Nml Nml Nml Nml Nml Nml Nml N/A  Right BicepsFemS Nml Nml Nml Nml Nml Nml Nml Nml N/A  Left AntTibialis Nml Nml Nml Nml Nml Nml Nml Nml N/A  Left  Gastroc Nml Nml Nml Nml Nml Nml Nml Nml N/A  Left Flex Dig Long Nml Nml Nml Nml Nml Nml Nml Nml N/A  Left RectFemoris Nml Nml Nml Nml Nml Nml Nml Nml N/A  Left BicepsFemS Nml Nml Nml Nml Nml Nml Nml Nml N/A     Waveforms:

## 2022-11-15 IMAGING — DX DG CERVICAL SPINE COMPLETE 4+V
6 series · 6 of 6 positions shown · non-contrast
Comparison: 10/27/2019 MRI

CLINICAL DATA: Left cervical neck pain and tightness

EXAM:
CERVICAL SPINE - COMPLETE 4+ VIEW

[c-spine lat]
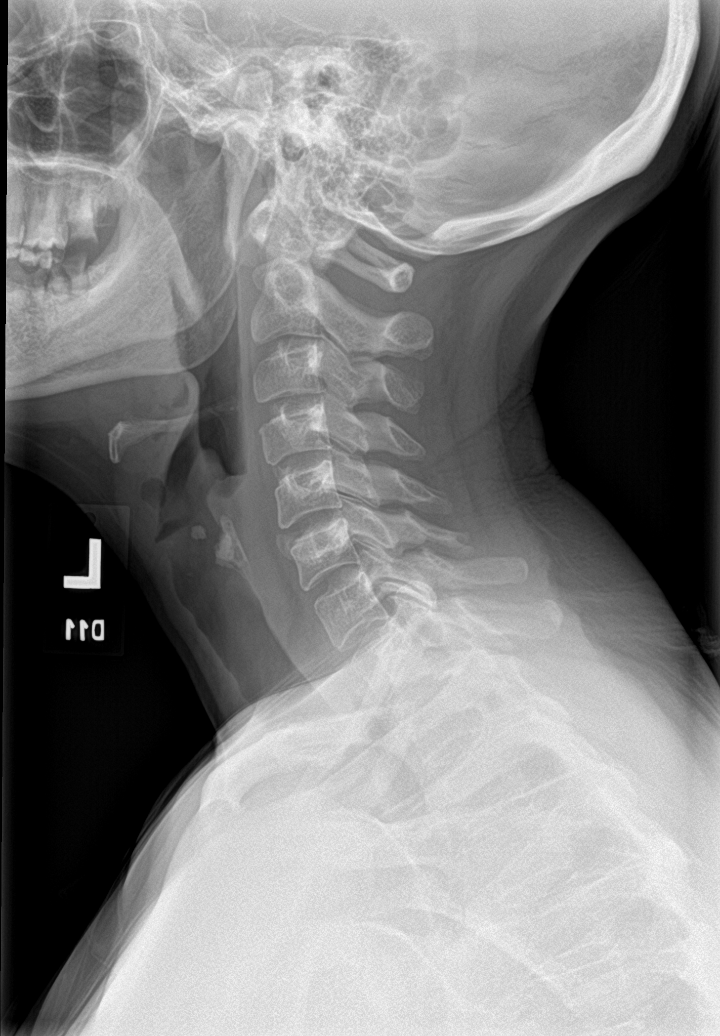

[c-spine obl (1 of 2)]
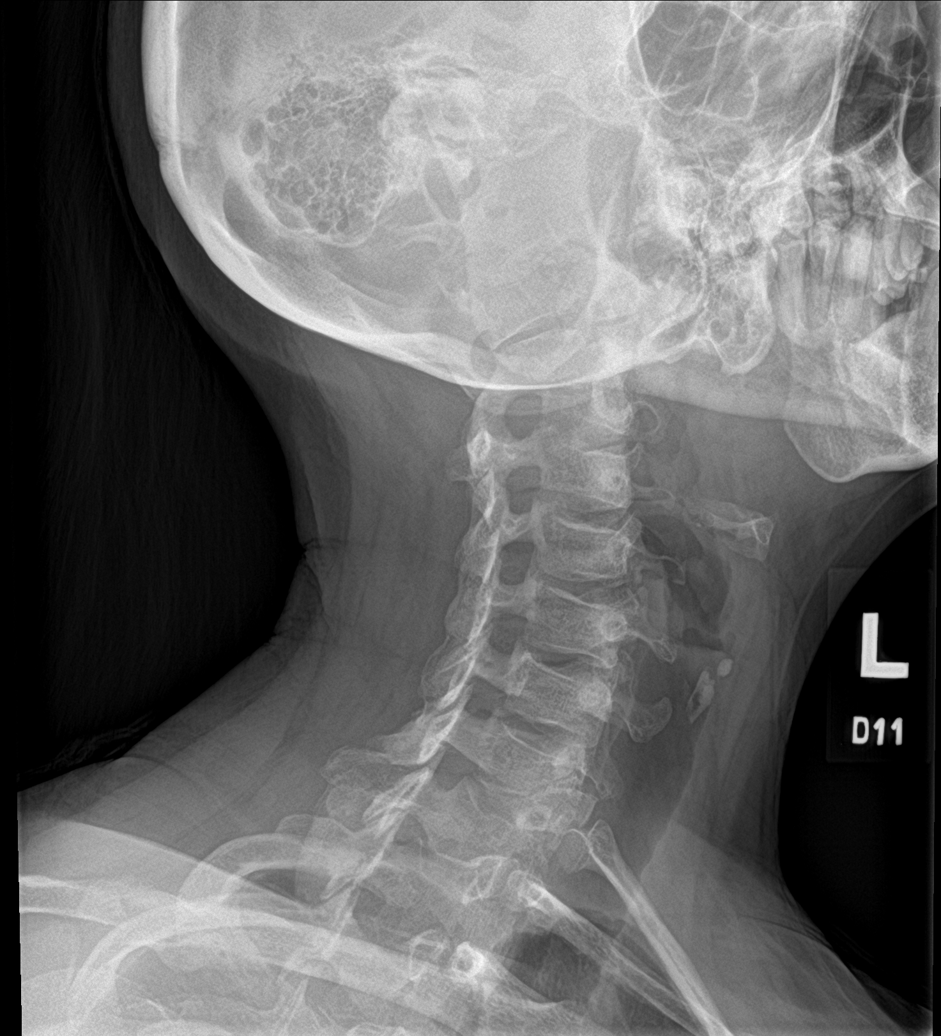

[c-spine obl (2 of 2)]
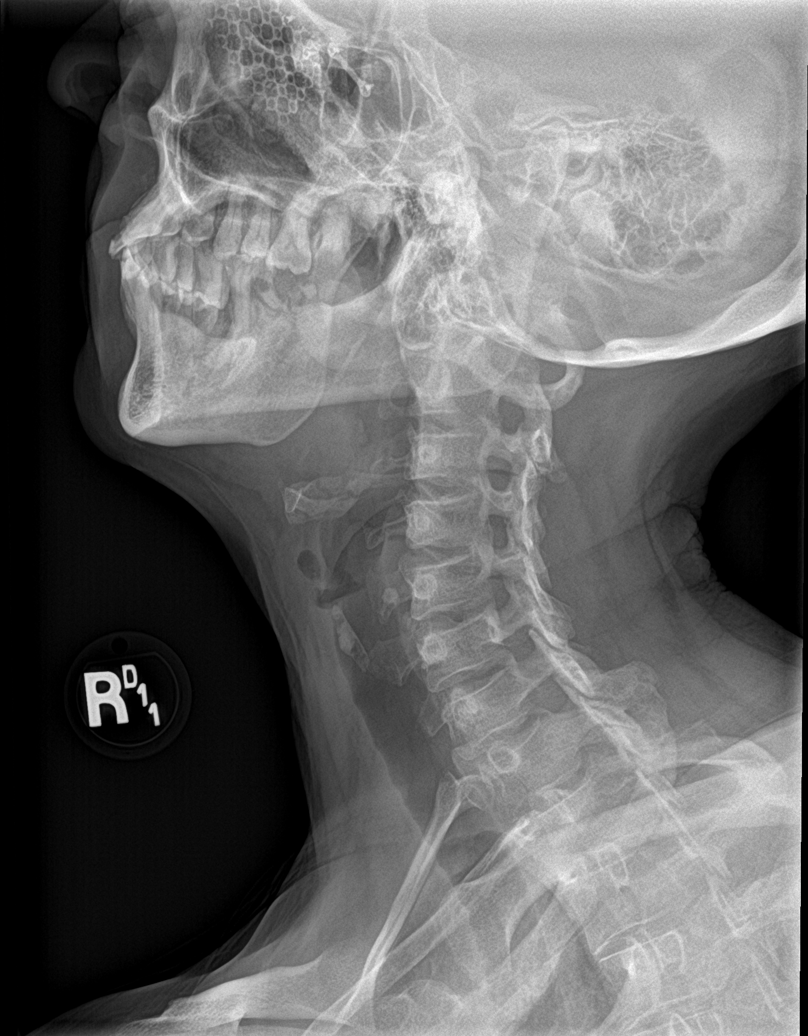

[c-spine ap]
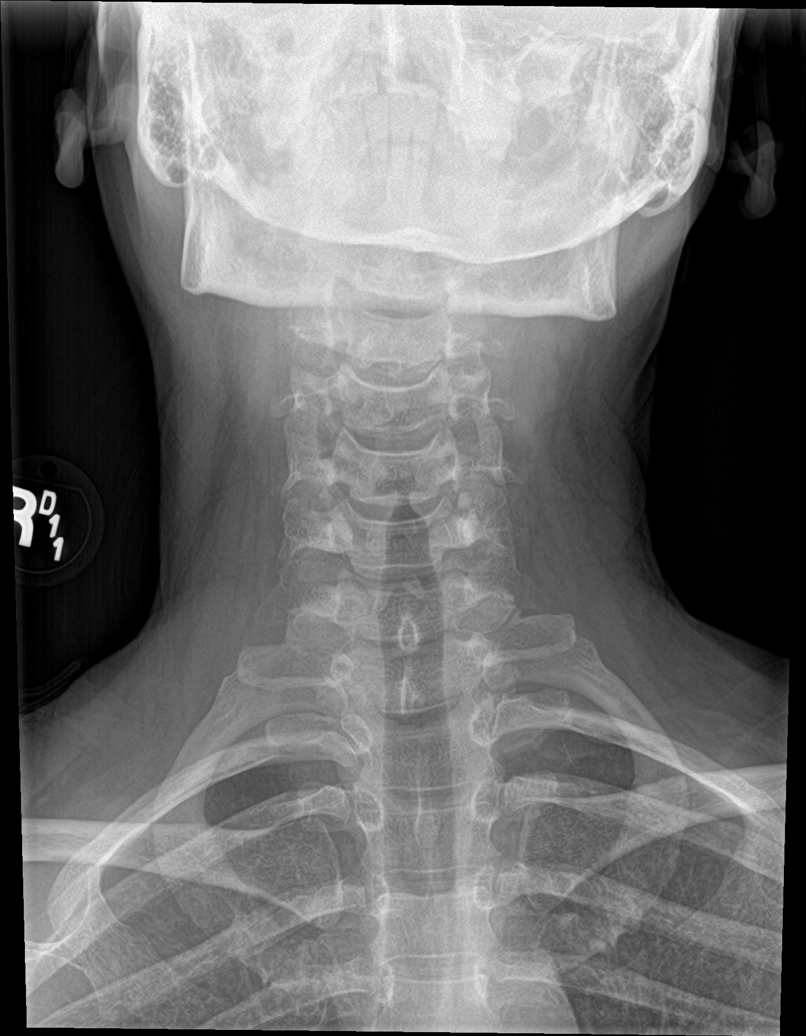

[c-spine open mouth]
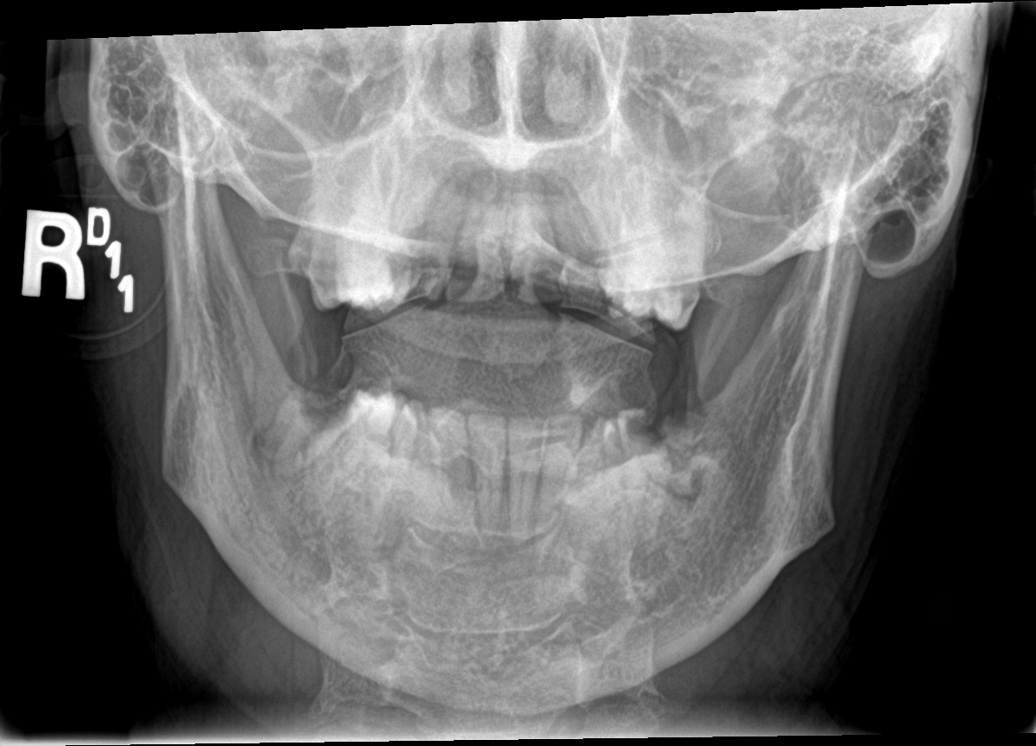

[skull pa]
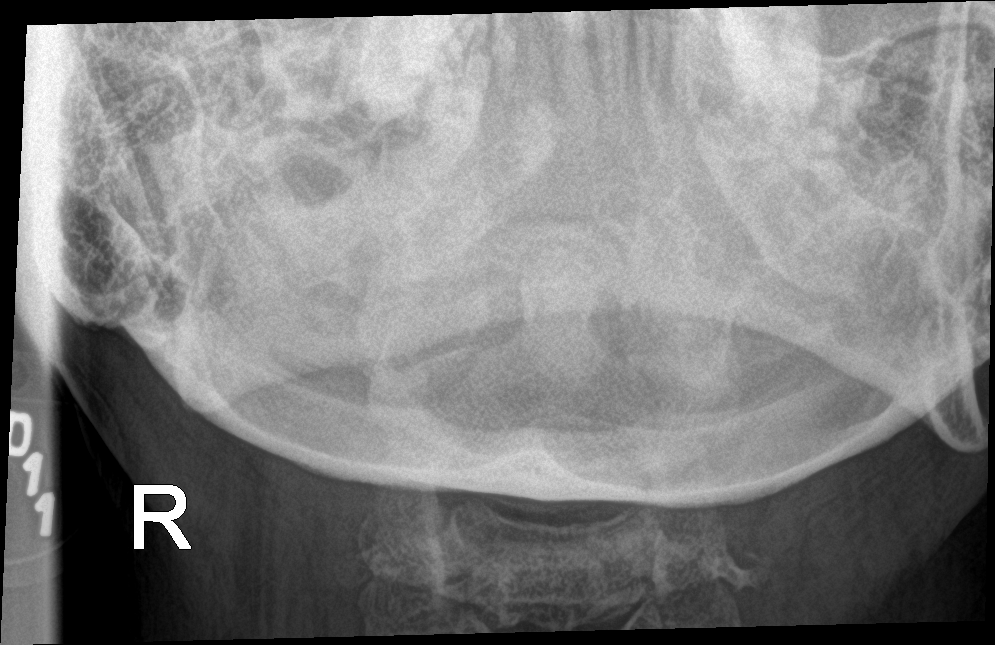

[6 of 6 positions shown; findings below may reference images not displayed]

FINDINGS: There is no evidence of cervical spine fracture or prevertebral soft
tissue swelling. Alignment is normal. No other significant bone
abnormalities are identified.
IMPRESSION: Negative cervical spine radiographs.

## 2022-12-03 ENCOUNTER — Ambulatory Visit (INDEPENDENT_AMBULATORY_CARE_PROVIDER_SITE_OTHER): Payer: Medicaid Other

## 2022-12-03 ENCOUNTER — Telehealth: Payer: Self-pay | Admitting: Podiatry

## 2022-12-03 ENCOUNTER — Ambulatory Visit: Payer: Medicaid Other | Admitting: Podiatry

## 2022-12-03 DIAGNOSIS — D492 Neoplasm of unspecified behavior of bone, soft tissue, and skin: Secondary | ICD-10-CM | POA: Diagnosis not present

## 2022-12-03 DIAGNOSIS — M79671 Pain in right foot: Secondary | ICD-10-CM

## 2022-12-03 DIAGNOSIS — G5761 Lesion of plantar nerve, right lower limb: Secondary | ICD-10-CM

## 2022-12-03 DIAGNOSIS — M79672 Pain in left foot: Secondary | ICD-10-CM

## 2022-12-03 DIAGNOSIS — M216X2 Other acquired deformities of left foot: Secondary | ICD-10-CM

## 2022-12-03 DIAGNOSIS — M216X1 Other acquired deformities of right foot: Secondary | ICD-10-CM | POA: Diagnosis not present

## 2022-12-03 DIAGNOSIS — G5762 Lesion of plantar nerve, left lower limb: Secondary | ICD-10-CM | POA: Diagnosis not present

## 2022-12-03 NOTE — Progress Notes (Signed)
  Subjective:  Patient ID: Melanie Dillon, female    DOB: 03/22/1981,  MRN: [id number]  Chief Complaint  Patient presents with   Foot Pain    B/ l  ball/sides of feet pain    42 y.o. female presents with concern for pain in the bilateral interspace second interspace.  Does report burning tingling and numbness as well in the forefoot.  Says it sometimes radiates to the toes.  Past Medical History:  Diagnosis Date   Arthritis    Asthma    Chronic back pain greater than 3 months duration 2014   Common migraine with intractable migraine 08/24/2018   Headache disorder 12/25/2014   Myofascial pain syndrome on right   History of radiation therapy 12/25/15- 02/04/16   Right Meningioma, 52.2 Gy in 29 fractions.    Myofascial pain syndrome 12/25/2014   right    Allergies  Allergen Reactions   Hydrocodone    Latex Itching   Mobic [Meloxicam] Swelling    Swelling of legs   Ropinirole Hcl    Gabapentin Rash    ROS: Negative except as per HPI above  Objective:  General: AAO x3, NAD  Dermatological: With inspection and palpation of the right and left lower extremities there are no open sores, no preulcerative lesions, no rash or signs of infection present. Nails are of normal length thickness and coloration.   Vascular:  Dorsalis Pedis artery and Posterior Tibial artery pedal pulses are 2/4 bilateral.  Capillary fill time < 3 sec to all digits.   Neruologic: Grossly intact via light touch bilateral. Protective threshold intact to all sites bilateral.  Subjective sensation of burning and tingling radiating to the second and third toes  Musculoskeletal: Pain with palpation of the second interspace and between the second and third metatarsal head bilateral foot.  No palpable Mulder's click  Gait: Unassisted, Nonantalgic.   No images are attached to the encounter.  Radiographs:  Date: 12/03/2022 XR bilateral foot weightbearing AP/Lateral/Oblique   Findings: No evidence of osseous  abnormality or fracture there is crowding of the second and third metatarsal heads bilaterally. Assessment:   1. Tumor of soft tissues   2. Neuroma of second interspace of left foot   3. Neuroma of second interspace of right foot   4. Neuropathic pain   5. Foot pain, bilateral      Plan:  Patient was evaluated and treated and all questions answered.  # Bilateral second interspace neuroma -Discussed with patient she is likely dealing from the other neuroma versus metatarsalgia versus -Recommend steroid injection bilateral second interspace -After sterile prep injected 1 cc of Marcaine and 1 cc Kenalog 10 into the second interspace bilateral foot  # Neuropathic pain -Recommend referral to neurology for further evaluation of neuropathic pain patient states she is not able to take gabapentin or Lyrica          Corinna Gab, DPM [facility]

## 2022-12-03 NOTE — Telephone Encounter (Signed)
Patient called and stated hydrocodone got added as an allergy today, but she is not allergic to it.  She takes it and doesn't have any allergic reactions to it.  I removed it from her allergy list.  She forgot to tell Dr. Annamary Rummage that she had an EMG done 05/27/22 and it was normal (it is under procedures in Epic).  She didn't know if you need to cancel her neurology consult since it might be for an EMG and she already had it done.   Call back # [phone]

## 2022-12-14 ENCOUNTER — Other Ambulatory Visit: Payer: Self-pay | Admitting: Podiatry

## 2022-12-14 DIAGNOSIS — M792 Neuralgia and neuritis, unspecified: Secondary | ICD-10-CM

## 2022-12-14 DIAGNOSIS — D492 Neoplasm of unspecified behavior of bone, soft tissue, and skin: Secondary | ICD-10-CM

## 2023-01-05 ENCOUNTER — Ambulatory Visit: Payer: Medicaid Other | Admitting: Neurology

## 2023-01-05 ENCOUNTER — Encounter: Payer: Self-pay | Admitting: Neurology

## 2023-01-05 VITALS — BP 114/75 | HR 72 | Ht 59.0 in | Wt 135.0 lb

## 2023-01-05 DIAGNOSIS — M79671 Pain in right foot: Secondary | ICD-10-CM

## 2023-01-05 DIAGNOSIS — G8929 Other chronic pain: Secondary | ICD-10-CM

## 2023-01-05 DIAGNOSIS — M79672 Pain in left foot: Secondary | ICD-10-CM | POA: Diagnosis not present

## 2023-01-05 NOTE — Patient Instructions (Signed)
Skin biopsy  Follow-up with pain management

## 2023-01-05 NOTE — Progress Notes (Signed)
Follow-up Visit   Date: 01/05/2023    Melanie Dillon MRN: [id number] DOB: [birth year]    Melanie Dillon is a 42 y.o. right-handed Caucasian female with right meningioma s/p resection, migraine, and chronic pain returning to the clinic for follow-up of bilateral feet pain.  The patient was accompanied to the clinic by self.  IMPRESSION/PLAN: Chronic bilateral feet pain.  Prior NCS/EMG was normal.  Evaluation by podiatry indicates that she has bilateral feet neuromas, which is most likely causing her pain.  To be complete, I will order skin biopsy but my overall suspicion for small fiber neuropathy is low.  Patient was informed that if testing did happen to show neuropathy, management would be unchanged and still be symptomatic. She has tried gabapentin, Lyrica, Cymbalta, and nortriptyline.  She is established with pain management and should continue to follow-up with them.    --------------------------------------------- History of present illness: She complains of achy and soreness of the soles of her feet which started in 2014.  She preferred to walk on the outer side of the feet.  Pain is relived with rest.  She occasionally has numbness/tingling of the pain, but this is not the primary complaint.  She quit working at [employer] and quit working because of the pain. She does not get relief with NSAIDs or tylenol.  She was seeing sports medicine who performed x-ray of the feet which was normal.  She is referred for concern of neuropathy.  She has no history of diabetes or history of chemotherapy.  She drinks several shots about once per week.     UPDATE 01/05/2023:  She is here at the request of podiatry to evaluate and manage her feet pain.  She has previously tried gabapentin, Lyrica, cymbalta, and nortriptyline.  She sees [facility] account manager and takes hydrocodone.  Prior NCS/EMG was normal.  She continues to have intense soreness balls of the feet and toes.  Both feet are  involved the same. She has some numbness in the toes.  She recalls that her pain used to be so severe that she would walk on the lateral feet to avoid weight-bearing on the soles of her feet.    Medications:  Current Outpatient Medications on File Prior to Visit  Medication Sig Dispense Refill   albuterol (PROVENTIL HFA;VENTOLIN HFA) 108 (90 BASE) MCG/ACT inhaler Inhale 2 puffs into the lungs every 6 (six) hours as needed for wheezing or shortness of breath.     HYDROcodone-acetaminophen (NORCO/VICODIN) 5-325 MG tablet Take 1 tablet by mouth daily as needed.     ibuprofen (ADVIL,MOTRIN) 800 MG tablet Take 800 mg by mouth 3 (three) times daily.     medroxyPROGESTERone (DEPO-PROVERA) 150 MG/ML injection Inject 1 mL into the muscle every 3 (three) months.  3   methocarbamol (ROBAXIN) 500 MG tablet Take 1 tablet (500 mg total) by mouth 4 (four) times daily. 120 tablet 5   rOPINIRole (REQUIP) 0.5 MG tablet Take 0.5 mg by mouth at bedtime.  3   No current facility-administered medications on file prior to visit.    Allergies:  Allergies  Allergen Reactions   Latex Itching   Mobic [Meloxicam] Swelling    Swelling of legs   Ropinirole Hcl    Gabapentin Rash    Vital Signs:  BP 114/75   Pulse 72   Ht 4\' 11"  (1.499 m)   Wt 135 lb (61.2 kg)   SpO2 99%   BMI 27.27 kg/m     Neurological  Exam: MENTAL STATUS including orientation to time, place, person, recent and remote memory, attention span and concentration, language, and fund of knowledge is normal.  Speech is not dysarthric.  CRANIAL NERVES:  Pupils equal round and reactive to light.  Normal conjugate, extra-ocular eye movements in all directions of gaze.  No ptosis.  Face is symmetric.   MOTOR:  Motor strength is 5/5 in all extremities, including distally.  No atrophy, fasciculations or abnormal movements.  No pronator drift.  Tone is normal.    MSRs:  Reflexes are 2+/4 throughout.  SENSORY:  Intact to vibration and pin prick  throughout.  COORDINATION/GAIT:    Gait narrow based and stable.   Data: NCS/EMG of the legs 05/07/2022: This is a normal study of the lower extremities.  In particular, there is no evidence of a large fiber sensorimotor polyneuropathy or lumbosacral radiculopathy.  MRI brain wwo contrast 01/06/2022: 1. No change since the previous study. Previous pterional craniotomy on the right for meningioma resection. Stable dural thickening and enhancement along the greater wing of the sphenoid on the right. Some enhancement occurs within the right optic canal and along the right V3 nerve, unchanged. Mild exophthalmos on the right is unchanged. 2. Scattered foci of T2 and FLAIR signal within the cerebral hemispheric white matter, unchanged.   Thank you for allowing me to participate in patient's care.  If I can answer any additional questions, I would be pleased to do so.    Sincerely,    Yuritzy Zehring K. Allena Katz, DO

## 2023-01-06 ENCOUNTER — Telehealth: Payer: Self-pay | Admitting: Neurology

## 2023-01-06 NOTE — Telephone Encounter (Signed)
Patient is calling back . About a skin biopsy

## 2023-01-06 NOTE — Progress Notes (Signed)
Called patients insurance and no PA is needed for CPT 11104 and CPT 11105. Call reference#: [phone]

## 2023-02-03 ENCOUNTER — Other Ambulatory Visit: Payer: Self-pay | Admitting: Neurosurgery

## 2023-02-03 DIAGNOSIS — D329 Benign neoplasm of meninges, unspecified: Secondary | ICD-10-CM

## 2023-02-08 ENCOUNTER — Other Ambulatory Visit: Payer: Medicaid Other | Admitting: Neurology

## 2023-02-09 ENCOUNTER — Ambulatory Visit: Payer: Medicaid Other | Admitting: Neurology

## 2023-02-09 DIAGNOSIS — G8929 Other chronic pain: Secondary | ICD-10-CM | POA: Diagnosis not present

## 2023-02-09 DIAGNOSIS — M79671 Pain in right foot: Secondary | ICD-10-CM

## 2023-02-09 DIAGNOSIS — R209 Unspecified disturbances of skin sensation: Secondary | ICD-10-CM

## 2023-02-09 DIAGNOSIS — M79672 Pain in left foot: Secondary | ICD-10-CM | POA: Diagnosis not present

## 2023-02-09 NOTE — Progress Notes (Signed)
Punch Biopsy Procedure Note  Preprocedure Diagnosis: disturbance of skin sensation   Postprocedure Diagnosis: same  Locations: Site 1: Right lateral distal leg;  Site 2: Right lateral thigh  Indications: r/o small fiber neuropathy  Anesthesia: 5 mL Lidocaine 1% with epinephrine  Procedure Details Patient informed of the risks (including but not limited to bleeding, pain, infection, scar and infection) and benefits of the procedure.  Informed consent obtained.  The areas which were chosen for biopsy, as above, and surrounding areas were given a sterile prep using alcohol and iodine. The skin was then stretched perpendicular to the skin tension lines and sample removed using the 3 mm punch. Pressure applied, hemostasis achieved.   Dressing applied. The specimen(s) was sent for pathologic examination. The patient tolerated the procedure well.  Estimated Blood Loss: 1 ml  Condition: Stable  Complications: none.  Plan: 1. Instructed to keep the wound dry and covered for 24h and clean thereafter. 2. Warning signs of infection were reviewed.    Jacquelyne Balint, MD [facility]

## 2023-02-22 ENCOUNTER — Telehealth: Payer: Self-pay | Admitting: Neurology

## 2023-02-22 NOTE — Telephone Encounter (Signed)
Please let pt know that skin biopsy looking for small fiber neuropathy is normal.  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

## 2023-02-22 NOTE — Telephone Encounter (Signed)
Called patient and Left a message for a call back.

## 2023-02-23 ENCOUNTER — Inpatient Hospital Stay
Admission: RE | Admit: 2023-02-23 | Discharge: 2023-02-23 | Disposition: A | Discharge status: Home / Self Care | Payer: Medicaid Other | Source: Ambulatory Visit | Attending: Neurosurgery

## 2023-02-23 DIAGNOSIS — D329 Benign neoplasm of meninges, unspecified: Secondary | ICD-10-CM

## 2023-02-23 MED ORDER — GADOPICLENOL 0.5 MMOL/ML IV SOLN
7.0000 mL | Freq: Once | INTRAVENOUS | Status: AC | PRN
  Administered 2023-02-23: 7 mL via INTRAVENOUS

## 2023-02-23 NOTE — Telephone Encounter (Signed)
Called patient on both home phone and cell phone  and left a message for a call back.

## 2023-02-24 ENCOUNTER — Telehealth: Payer: Self-pay | Admitting: Neurology

## 2023-02-24 NOTE — Telephone Encounter (Signed)
Pt called in and left a message. She is returning our call about results

## 2023-02-24 NOTE — Telephone Encounter (Signed)
Called patient and left a message for a cal back. Will send Mychart message to inform.

## 2023-02-24 NOTE — Telephone Encounter (Signed)
Called patient and gave results.

## 2024-02-04 ENCOUNTER — Other Ambulatory Visit: Payer: Self-pay | Admitting: Neurosurgery

## 2024-02-04 ENCOUNTER — Encounter: Payer: Self-pay | Admitting: Neurosurgery

## 2024-02-04 DIAGNOSIS — D329 Benign neoplasm of meninges, unspecified: Secondary | ICD-10-CM

## 2024-03-12 ENCOUNTER — Ambulatory Visit
Admission: RE | Admit: 2024-03-12 | Discharge: 2024-03-12 | Disposition: A | Source: Ambulatory Visit | Attending: Neurosurgery

## 2024-03-12 DIAGNOSIS — D329 Benign neoplasm of meninges, unspecified: Secondary | ICD-10-CM

## 2024-03-12 MED ORDER — GADOPICLENOL 0.5 MMOL/ML IV SOLN
6.0000 mL | Freq: Once | INTRAVENOUS | Status: AC | PRN
  Administered 2024-03-12: 6 mL via INTRAVENOUS
